# Patient Record
Sex: Male | Born: 1985 | Race: White | Hispanic: No | Marital: Single | Smoking: Current every day smoker
Health system: Southern US, Community
[De-identification: ages and names within clinical notes are randomized; demographics above are authoritative.]

## PROBLEM LIST (undated history)

## (undated) DIAGNOSIS — B977 Papillomavirus as the cause of diseases classified elsewhere: Secondary | ICD-10-CM

## (undated) DIAGNOSIS — H8109 Meniere's disease, unspecified ear: Secondary | ICD-10-CM

## (undated) DIAGNOSIS — F1910 Other psychoactive substance abuse, uncomplicated: Secondary | ICD-10-CM

## (undated) HISTORY — DX: Other psychoactive substance abuse, uncomplicated: F19.10

## (undated) HISTORY — DX: Meniere's disease, unspecified ear: H81.09

---
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
## 2004-03-31 ENCOUNTER — Inpatient Hospital Stay (HOSPITAL_COMMUNITY): Admission: AC | Admit: 2004-03-31 | Discharge: 2004-04-02 | Payer: Self-pay

## 2005-07-08 ENCOUNTER — Ambulatory Visit: Payer: Self-pay | Admitting: Family Medicine

## 2005-09-09 ENCOUNTER — Ambulatory Visit: Payer: Self-pay | Admitting: Family Medicine

## 2006-05-07 HISTORY — PX: WISDOM TOOTH EXTRACTION: SHX21

## 2006-08-11 ENCOUNTER — Ambulatory Visit: Payer: Self-pay | Admitting: Internal Medicine

## 2006-08-24 ENCOUNTER — Ambulatory Visit: Payer: Self-pay | Admitting: Internal Medicine

## 2006-10-07 HISTORY — PX: LASIK: SHX215

## 2008-01-02 ENCOUNTER — Ambulatory Visit: Payer: Self-pay | Admitting: Family Medicine

## 2008-01-02 DIAGNOSIS — M795 Residual foreign body in soft tissue: Secondary | ICD-10-CM

## 2008-01-30 ENCOUNTER — Ambulatory Visit: Payer: Self-pay | Admitting: Family Medicine

## 2008-01-31 ENCOUNTER — Ambulatory Visit: Payer: Self-pay | Admitting: Family Medicine

## 2008-01-31 LAB — CONVERTED CEMR LAB
ALT: 17 units/L (ref 0–53)
AST: 19 units/L (ref 0–37)
Alkaline Phosphatase: 54 units/L (ref 39–117)
BUN: 10 mg/dL (ref 6–23)
Basophils Relative: 1 % (ref 0.0–1.0)
Bilirubin, Direct: 0.1 mg/dL (ref 0.0–0.3)
Calcium: 9.6 mg/dL (ref 8.4–10.5)
Chloride: 105 meq/L (ref 96–112)
Creatinine, Ser: 1 mg/dL (ref 0.4–1.5)
Eosinophils Relative: 3.5 % (ref 0.0–5.0)
GFR calc Af Amer: 119 mL/min
GFR calc non Af Amer: 98 mL/min
Glucose, Bld: 82 mg/dL (ref 70–99)
HCT: 44 % (ref 39.0–52.0)
HDL: 89.8 mg/dL (ref >39.0)
Lymphocytes Relative: 27.9 % (ref 12.0–46.0)
Monocytes Absolute: 0.7 10*3/uL (ref 0.2–0.7)
Monocytes Relative: 6.9 % (ref 3.0–11.0)
Neutro Abs: 6.1 10*3/uL (ref 1.4–7.7)
Neutrophils Relative %: 60.7 % (ref 43.0–77.0)
Platelets: 245 10*3/uL (ref 150–400)
Potassium: 4.4 meq/L (ref 3.5–5.1)
RDW: 12.6 % (ref 11.5–14.6)
TSH: 1.84 microintl units/mL (ref 0.35–5.50)
Total CHOL/HDL Ratio: 2.4
Total Protein: 6.6 g/dL (ref 6.0–8.3)
Triglycerides: 43 mg/dL (ref 0–149)
VLDL: 9 mg/dL (ref 0–40)
WBC: 10 10*3/uL (ref 4.5–10.5)

## 2009-08-27 ENCOUNTER — Ambulatory Visit: Payer: Self-pay | Admitting: Family Medicine

## 2009-08-27 DIAGNOSIS — G4730 Sleep apnea, unspecified: Secondary | ICD-10-CM | POA: Insufficient documentation

## 2009-08-31 ENCOUNTER — Encounter: Payer: Self-pay | Admitting: Family Medicine

## 2010-05-26 ENCOUNTER — Ambulatory Visit: Payer: Self-pay | Admitting: Family Medicine

## 2010-05-26 DIAGNOSIS — H9190 Unspecified hearing loss, unspecified ear: Secondary | ICD-10-CM | POA: Insufficient documentation

## 2010-05-27 ENCOUNTER — Encounter: Payer: Self-pay | Admitting: Family Medicine

## 2010-06-14 ENCOUNTER — Ambulatory Visit: Payer: Self-pay | Admitting: Family Medicine

## 2010-06-14 DIAGNOSIS — K5289 Other specified noninfective gastroenteritis and colitis: Secondary | ICD-10-CM | POA: Insufficient documentation

## 2010-06-15 ENCOUNTER — Encounter (INDEPENDENT_AMBULATORY_CARE_PROVIDER_SITE_OTHER): Payer: Self-pay | Admitting: *Deleted

## 2010-07-14 ENCOUNTER — Telehealth: Payer: Self-pay | Admitting: Family Medicine

## 2010-07-14 DIAGNOSIS — F429 Obsessive-compulsive disorder, unspecified: Secondary | ICD-10-CM

## 2010-10-04 ENCOUNTER — Encounter: Admission: RE | Admit: 2010-10-04 | Discharge: 2010-10-04 | Payer: Self-pay | Admitting: Otolaryngology

## 2010-11-27 ENCOUNTER — Encounter: Payer: Self-pay | Admitting: Otolaryngology

## 2010-12-09 NOTE — Consult Note (Signed)
Summary: [facility]  [facility]   Imported By: Lanelle Bal 06/02/2010 12:12:00  _____________________________________________________________________  External Attachment:    Type:   Image     Comment:   External Document

## 2010-12-09 NOTE — Progress Notes (Signed)
Summary: Wants referral for OCD  Phone Note Call from Patient Call back at [phone]   Caller: Patient Call For: Dr Milinda Antis Summary of Call: Pt would like to see psychologist or psychiatrist re to OCD. Pt wondered if he would have to make an appt with you to get this referral or can you do a referral with out pt being seen. Please advise.  Initial call taken by: Lewanda Rife LPN,  July 14, 2010 4:24 PM  Follow-up for Phone Call        that is fine -just let me know a few symptoms he is having so I can refer Follow-up by: Judith Part MD,  July 14, 2010 4:53 PM  Additional Follow-up for Phone Call Additional follow up Details #1::        Pt said it takes him awhile to leave his house in the mornings because he has to double ck the water being off, the stove is off, the doors are locked. and when the pt gets out of his car he has to double ck the cars in park, the windows are all up and the doors are locked. Pt would like Shirlee Limerick to call him before she makes an appt to discuss his availabilty and where he will be going. 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

## 2010-12-09 NOTE — Letter (Signed)
Summary: Out of Work  [employer] at [facility]  [address] [place], [place] [address]   Phone: [phone]  Fax: [phone]    June 14, 2010   Employee:  DALLIN MCCORKEL    To Whom It May Concern:   For Medical reasons, please excuse the above named employee from work for the following dates:  Start:   06/14/2010  End:   can return 06/16/2010 if he is feeling better   If you need additional information, please feel free to contact our office.         Sincerely,    Judith Part MD

## 2010-12-09 NOTE — Letter (Signed)
Summary: [name] letter  [place] at [facility]  [address] [place], [place] [address]   Phone: [phone]  Fax: [phone]       06/15/2010 MRN: [id number]  Lance Foley [address] [place], [place]  [address]  Dear Mr. [name] [place], and [place] announce the retirement of Arta Silence, M.D., from full-time practice at the [facility] office effective May 06, 2010 and his plans of returning part-time.  It is important to [name] and to our practice that you understand that [name] has seven physicians in our office for your health care needs.  We will continue to offer the same exceptional care that you have today.    [name] has spoken to many of you about his plans for retirement and returning part-time in the fall.   We will continue to work with you through the transition to schedule appointments for you in the office and meet the high standards that [facility] is committed to.   Again, it is with great pleasure that we share the news that [name] will return to [facility] at [facility] in October of 2011 with a reduced schedule.    If you have any questions, or would like to request an appointment with one of our physicians, please call us at [phone] and press the option for Scheduling an appointment.  We take pleasure in providing you with excellent patient care and look forward to seeing you at your next office visit.  Our [facility] Physicians are:  [name], M.D. [name], M.D. [name], M.D. [name], M.D. [name], M.D. [name], M.D. We proudly welcomed [name], M.D. and [name], M.D. to the practice in July/August 2011.  Sincerely,   [facility]
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
## 2011-03-30 ENCOUNTER — Encounter: Payer: Self-pay | Admitting: Family Medicine

## 2011-04-01 ENCOUNTER — Ambulatory Visit (INDEPENDENT_AMBULATORY_CARE_PROVIDER_SITE_OTHER): Payer: Managed Care, Other (non HMO) | Admitting: Family Medicine

## 2011-04-01 ENCOUNTER — Encounter: Payer: Self-pay | Admitting: Family Medicine

## 2011-04-01 DIAGNOSIS — Z136 Encounter for screening for cardiovascular disorders: Secondary | ICD-10-CM

## 2011-04-01 DIAGNOSIS — F430 Acute stress reaction: Secondary | ICD-10-CM | POA: Insufficient documentation

## 2011-04-01 DIAGNOSIS — R0602 Shortness of breath: Secondary | ICD-10-CM | POA: Insufficient documentation

## 2011-04-01 NOTE — Assessment & Plan Note (Signed)
I feel this may be causing some of his physical symptoms today  Disc opt for tx incl watchful waiting with good support / journal/ fitness/ caff cess/ smok cess Counseling with mental health prof And medciation  Pt chooses to manage on his own for now  Disc coping tech and sympt in detail >25 min spent with face to face with patient counseling and coordinating care Will update me if symptoms worsen

## 2011-04-01 NOTE — Patient Instructions (Signed)
I'm reassured by your EKG and exam today  I feel that some of your symptoms are likely part of a stress reaction  If you are interested in counseling or medication in the future - let us know  If you get worse -- alert me  Please work on gradual caffiene withdrawal , and smoking cessation and fitting some sort of exercise

## 2011-04-01 NOTE — Progress Notes (Signed)
Subjective:    Patient ID: Lance Foley, male    DOB: 1986-06-20, 25 y.o.   MRN: [id number]  HPI Tuesday was driving home and had the sudden sensation that he could not get a good complete breath Made him uneasy  Wed felt a bit worse- tired and generally bad-- ? 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  Eyes: Negative for pain and visual disturbance.  Respiratory: Negative for cough and pos for sob   Cardiovascular: Negative.for cp or edema    Gastrointestinal: Negative for nausea, diarrhea and constipation.  Genitourinary: Negative for urgency and frequency.  Skin: Negative for pallor.or rash   Neurological: Negative for weakness, light-headedness, numbness and headaches.  Hematological: Negative for adenopathy. Does not bruise/bleed easily.  Psychiatric/Behavioral: pos for stress  reaction and anxiety, no SI or lack of motivation        Objective:   Physical Exam  Constitutional: He appears well-developed and well-nourished. No distress.       Slim and well appearing   HENT:  Head: Normocephalic and atraumatic.  Mouth/Throat: Oropharynx is clear and moist.  Eyes: Conjunctivae and EOM are normal. Pupils are equal, round, and reactive to light.  Neck: Normal range of motion. Neck supple. No JVD present. No thyromegaly present.  Cardiovascular: Normal rate, regular rhythm, normal heart sounds and intact distal pulses.   Pulmonary/Chest: Effort normal and breath sounds normal. No respiratory distress. He has no wheezes. He has no rales. He exhibits no tenderness.  Abdominal: Soft. Bowel sounds are normal. He exhibits no distension and no mass. There is no tenderness.  Musculoskeletal: Normal range of motion. He exhibits no edema and no tenderness.  Lymphadenopathy:    He has no cervical adenopathy.  Neurological: He is alert. He displays normal reflexes. No cranial nerve deficit. Coordination normal.       No tremor   Skin: Skin is warm and dry. No rash noted. No erythema. No pallor.  Psychiatric: His behavior is normal.       Seems stressed and mildly anxious Intense affect Nl eye contact  Comm skills are good           Assessment & Plan:

## 2011-04-01 NOTE — Assessment & Plan Note (Addendum)
Suspect this and twinges of chest pain (atypical) are likely due to stress reaction Disc this in detail Pt does have remote hx of depression and ocd Re assuring EKG and no exertional symptoms (EKG with nl sinus rhythm rate of 73 and no acute changes) Symptom free today Will update if this becomes recurrent

## 2012-01-10 ENCOUNTER — Encounter: Payer: Self-pay | Admitting: Family Medicine

## 2012-01-10 ENCOUNTER — Ambulatory Visit (INDEPENDENT_AMBULATORY_CARE_PROVIDER_SITE_OTHER): Payer: BC Managed Care – PPO | Admitting: Family Medicine

## 2012-01-10 VITALS — BP 120/86 | HR 69 | Temp 98.3°F | Wt 161.0 lb

## 2012-01-10 DIAGNOSIS — F411 Generalized anxiety disorder: Secondary | ICD-10-CM

## 2012-01-10 DIAGNOSIS — F418 Other specified anxiety disorders: Secondary | ICD-10-CM | POA: Insufficient documentation

## 2012-01-10 DIAGNOSIS — F419 Anxiety disorder, unspecified: Secondary | ICD-10-CM

## 2012-01-10 MED ORDER — SERTRALINE HCL 25 MG PO TABS: 25.0000 mg | ORAL_TABLET | Freq: Every day | ORAL | Status: DC
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
## 2012-01-10 NOTE — Assessment & Plan Note (Signed)
With some OCD features including intrusive thoughts/ negativity/ and repeated checking/ worry  Coping skills and insight are fair  Some anhedonia Pt not interested in counseling yet Has family history  Worries about med re: family rxn to it also  Made plan to start zoloft (generic is important with his insurance) - 25 mg (slow start) Rev poss side eff in detail incl worse symptoms or SI- voices understanding to stop med and call if problems >25 min spent with face to face with patient, >50% counseling and/or coordinating care   F/u 4-6 weeks

## 2012-01-10 NOTE — Patient Instructions (Signed)
Start zoloft 25 mg daily - take it in evening  If any side effects - like feeling more anxious or depressed- stop it and let me know  Try to get some exercise  Try to limit caffeine to 1 cup of coffee per day  Drink water and eat a healthy diet

## 2012-02-14 ENCOUNTER — Ambulatory Visit: Payer: BC Managed Care – PPO | Admitting: Family Medicine

## 2012-02-14 DIAGNOSIS — Z0289 Encounter for other administrative examinations: Secondary | ICD-10-CM

## 2012-08-01 ENCOUNTER — Encounter: Payer: Self-pay | Admitting: Family Medicine

## 2012-08-01 ENCOUNTER — Ambulatory Visit (INDEPENDENT_AMBULATORY_CARE_PROVIDER_SITE_OTHER): Payer: BC Managed Care – PPO | Admitting: Family Medicine

## 2012-08-01 VITALS — BP 142/88 | HR 94 | Temp 98.5°F | Ht 70.0 in | Wt 168.2 lb

## 2012-08-01 DIAGNOSIS — J209 Acute bronchitis, unspecified: Secondary | ICD-10-CM

## 2012-08-01 DIAGNOSIS — F17200 Nicotine dependence, unspecified, uncomplicated: Secondary | ICD-10-CM | POA: Insufficient documentation

## 2012-08-01 MED ORDER — AZITHROMYCIN 250 MG PO TABS: ORAL_TABLET | ORAL | Status: DC

## 2012-08-01 MED ORDER — ALBUTEROL SULFATE HFA 108 (90 BASE) MCG/ACT IN AERS: 2.0000 | INHALATION_SPRAY | RESPIRATORY_TRACT | Status: DC | PRN

## 2012-08-01 NOTE — Assessment & Plan Note (Signed)
In a smoker with rhonchi and wheeze on exam tx with zpack/ albuterol mdi (inst in use) and sympt care Disc symptomatic care - see instructions on AVS  Update if not starting to improve in a week or if worsening  Counseled to quit smoking

## 2012-08-01 NOTE — Assessment & Plan Note (Signed)
Disc in detail risks of smoking and possible outcomes including copd, vascular/ heart disease, cancer , respiratory and sinus infections  Pt voices understanding Pt is using an e cig part time now

## 2012-08-01 NOTE — Patient Instructions (Addendum)
Drink lots of fluids Rest  Continue mucinex (the DM is good for cough if you feel like you want to switch)  Take the zpak as directed Albuterol inhaler as needed for wheezing or tightness  Update if not starting to improve in a week or if worsening   Think hard about quitting smoking
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
## 2012-10-24 ENCOUNTER — Encounter: Payer: Self-pay | Admitting: Family Medicine

## 2012-10-24 ENCOUNTER — Ambulatory Visit (INDEPENDENT_AMBULATORY_CARE_PROVIDER_SITE_OTHER): Payer: BC Managed Care – PPO | Admitting: Family Medicine

## 2012-10-24 VITALS — BP 130/85 | HR 82 | Temp 98.4°F | Ht 70.0 in | Wt 179.8 lb

## 2012-10-24 DIAGNOSIS — D1801 Hemangioma of skin and subcutaneous tissue: Secondary | ICD-10-CM

## 2012-10-24 NOTE — Patient Instructions (Addendum)
The skin spot on your arm appears to be an angioma-not worried about it - keep an eye on it  Wear your sun protection  Blood pressure has been trending up a bit - we want to watch that  Start regular exercise- aim for 5 days per week 30 minutes as your goal -and do something you enjoy  Schedule a physical with labs prior at check out

## 2012-10-24 NOTE — Progress Notes (Signed)
  Subjective:    Patient ID: Lance Foley, male    DOB: 1986/02/03, 26 y.o.   MRN: [id number]  HPI Has a spot/mole on L arm to check  There for at least 1 month No pain or itching   Has been sunburned on arms in the past  Is careful about sunblock now   bp was a bit high today Re check 130/85 Knows he needs to start exercising    Review of Systems Review of Systems  Constitutional: Negative for fever, appetite change, fatigue and unexpected weight change.  Eyes: Negative for pain and visual disturbance.  Respiratory: Negative for cough and shortness of breath.   Cardiovascular: Negative for cp or palpitations    Gastrointestinal: Negative for nausea, diarrhea and constipation.  Genitourinary: Negative for urgency and frequency.  Skin: Negative for pallor or rash   Neurological: Negative for weakness, light-headedness, numbness and headaches.  Hematological: Negative for adenopathy. Does not bruise/bleed easily.  Psychiatric/Behavioral: Negative for dysphoric mood. The patient is not nervous/anxious.         Objective:   Physical Exam  Constitutional: He appears well-developed and well-nourished. No distress.  HENT:  Head: Normocephalic and atraumatic.  Eyes: Conjunctivae normal and EOM are normal. Pupils are equal, round, and reactive to light.  Neck: Normal range of motion. Neck supple.  Cardiovascular: Normal rate and regular rhythm.   Pulmonary/Chest: Effort normal and breath sounds normal.  Musculoskeletal: He exhibits no edema.  Lymphadenopathy:    He has no cervical adenopathy.  Neurological: He is alert. He has normal reflexes.  Skin: Skin is warm and dry. No rash noted. No erythema. No pallor.       Tiny red lesion consistent with angioma on L forearm Several on back as well Some solar lentigos No rash  Psychiatric: He has a normal mood and affect.          Assessment & Plan:

## 2012-10-24 NOTE — Assessment & Plan Note (Signed)
Tiny 1-2 mm red macular lesion on L forearm consistent with small angioma Saw several on back also Reassured  Will watch this  Pt will schedule PE upcoming as well Disc skin ca prev

## 2012-12-11 ENCOUNTER — Encounter: Payer: Self-pay | Admitting: Family Medicine

## 2012-12-11 ENCOUNTER — Ambulatory Visit (INDEPENDENT_AMBULATORY_CARE_PROVIDER_SITE_OTHER): Payer: BC Managed Care – PPO | Admitting: Family Medicine

## 2012-12-11 VITALS — BP 132/90 | HR 90 | Temp 98.2°F | Ht 70.0 in | Wt 180.5 lb

## 2012-12-11 DIAGNOSIS — G933 Postviral fatigue syndrome: Secondary | ICD-10-CM | POA: Insufficient documentation

## 2012-12-11 DIAGNOSIS — R5381 Other malaise: Secondary | ICD-10-CM

## 2012-12-11 DIAGNOSIS — R5383 Other fatigue: Secondary | ICD-10-CM

## 2012-12-11 LAB — CBC WITH DIFFERENTIAL/PLATELET
Basophils Relative: 0.5 % (ref 0.0–3.0)
Eosinophils Absolute: 0.3 10*3/uL (ref 0.0–0.7)
Eosinophils Relative: 3.3 % (ref 0.0–5.0)
Hemoglobin: 17.2 g/dL — ABNORMAL HIGH (ref 13.0–17.0)
MCHC: 33.9 g/dL (ref 30.0–36.0)
Monocytes Absolute: 0.7 10*3/uL (ref 0.1–1.0)
Monocytes Relative: 7.5 % (ref 3.0–12.0)
Neutro Abs: 6.1 10*3/uL (ref 1.4–7.7)
Neutrophils Relative %: 66 % (ref 43.0–77.0)
Platelets: 246 10*3/uL (ref 150.0–400.0)
RBC: 5.43 Mil/uL (ref 4.22–5.81)
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
## 2012-12-11 NOTE — Assessment & Plan Note (Signed)
See assessment for fatigue Pt had flu like illness followed by proufound fatigue (but no other symptoms) Nl exam  Lab today  Adv out of work until mon to rest and update if no further imp

## 2012-12-11 NOTE — Assessment & Plan Note (Signed)
S/p viral illness- suspect a post viral syndrome-he may have had the flu  With lack of other symptoms besides fatigue and also nl exam - will also check cbc and tsh today Adv pt to please call if he develops cough or fever or other symptoms

## 2012-12-11 NOTE — Patient Instructions (Addendum)
I think you have a post viral syndrome but we are still doing some labs for fatigue today Work note written- get as much rest as you can  Drink lots of fluids If new symptoms begin like cough/ fever / facial pain - please call and let me know - or if you do not improve

## 2012-12-12 ENCOUNTER — Encounter: Payer: Self-pay | Admitting: *Deleted

## 2012-12-13 ENCOUNTER — Telehealth: Payer: Self-pay

## 2012-12-13 NOTE — Telephone Encounter (Signed)
Pt request copy of record for 12/11/12 visit to give to employer and request lab results.left v/m for pt to call back.

## 2012-12-14 NOTE — Telephone Encounter (Signed)
Left voicemail letting pt know record from [facility] ready for pick-up and that we mailed a copy of his labs to his home already

## 2013-02-27 ENCOUNTER — Other Ambulatory Visit: Payer: BC Managed Care – PPO

## 2013-03-06 ENCOUNTER — Encounter: Payer: BC Managed Care – PPO | Admitting: Family Medicine

## 2013-06-10 ENCOUNTER — Telehealth: Payer: Self-pay | Admitting: Family Medicine

## 2013-06-10 DIAGNOSIS — Z0000 Encounter for general adult medical examination without abnormal findings: Secondary | ICD-10-CM

## 2013-06-10 DIAGNOSIS — Z0001 Encounter for general adult medical examination with abnormal findings: Secondary | ICD-10-CM | POA: Insufficient documentation

## 2013-06-10 NOTE — Telephone Encounter (Signed)
Message copied by Judy Pimple on Mon Jun 10, 2013 10:03 PM ------      Message from: Alvina Chou      Created: Tue May 28, 2013 11:36 AM      Regarding: Lab orders for Tuesday, 8.5.14       Patient is scheduled for CPX labs, please order future labs, Thanks , Terri       ------

## 2013-06-11 ENCOUNTER — Other Ambulatory Visit: Payer: BC Managed Care – PPO

## 2013-06-18 ENCOUNTER — Encounter: Payer: BC Managed Care – PPO | Admitting: Family Medicine

## 2013-09-05 ENCOUNTER — Encounter: Payer: Self-pay | Admitting: Family Medicine

## 2013-09-05 ENCOUNTER — Ambulatory Visit (INDEPENDENT_AMBULATORY_CARE_PROVIDER_SITE_OTHER): Payer: Self-pay | Admitting: Family Medicine

## 2013-09-05 VITALS — BP 120/84 | HR 71 | Temp 98.0°F | Ht 70.0 in | Wt 175.5 lb

## 2013-09-05 DIAGNOSIS — S60559A Superficial foreign body of unspecified hand, initial encounter: Secondary | ICD-10-CM

## 2013-09-05 DIAGNOSIS — S60552A Superficial foreign body of left hand, initial encounter: Secondary | ICD-10-CM
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
## 2013-11-12 ENCOUNTER — Telehealth: Payer: Self-pay | Admitting: Family Medicine

## 2013-11-12 NOTE — Telephone Encounter (Signed)
Please put him in a 30 min slot-thanks

## 2013-11-12 NOTE — Telephone Encounter (Signed)
Left mssg for pt to c/b °

## 2013-11-12 NOTE — Telephone Encounter (Signed)
Pt scheduled 11/20/2013

## 2013-11-12 NOTE — Telephone Encounter (Signed)
Pt needs a CPE prior to the end of February, 2015 due to insurance purposes, however, your next available CPE apptmt is not until 03/11/2014. Can you accommodate pt a CPE prior to the end of February? Thank you.

## 2013-11-13 ENCOUNTER — Other Ambulatory Visit: Payer: Self-pay

## 2013-11-13 ENCOUNTER — Telehealth: Payer: Self-pay

## 2013-11-13 DIAGNOSIS — R5383 Other fatigue: Secondary | ICD-10-CM

## 2013-11-13 NOTE — Telephone Encounter (Signed)
I added it for dx of fatigue

## 2013-11-13 NOTE — Telephone Encounter (Signed)
Left voicemail letting pt know lab test added

## 2013-11-13 NOTE — Telephone Encounter (Signed)
I added it

## 2013-11-13 NOTE — Telephone Encounter (Signed)
Pt left v/m; pt scheduled for CPX lab on 0/08/15 and request to add testosterone level.Please advise.

## 2013-11-14 ENCOUNTER — Other Ambulatory Visit (INDEPENDENT_AMBULATORY_CARE_PROVIDER_SITE_OTHER): Payer: BC Managed Care – PPO

## 2013-11-14 DIAGNOSIS — R5381 Other malaise: Secondary | ICD-10-CM

## 2013-11-14 DIAGNOSIS — R5383 Other fatigue: Secondary | ICD-10-CM

## 2013-11-14 DIAGNOSIS — Z0000 Encounter for general adult medical examination without abnormal findings: Secondary | ICD-10-CM

## 2013-11-14 LAB — CBC WITH DIFFERENTIAL/PLATELET
BASOS ABS: 0 10*3/uL (ref 0.0–0.1)
Basophils Relative: 0.3 % (ref 0.0–3.0)
EOS ABS: 0.2 10*3/uL (ref 0.0–0.7)
Eosinophils Relative: 1.4 % (ref 0.0–5.0)
HCT: 43.1 % (ref 39.0–52.0)
HEMOGLOBIN: 14.9 g/dL (ref 13.0–17.0)
LYMPHS PCT: 16.7 % (ref 12.0–46.0)
Lymphs Abs: 2.3 10*3/uL (ref 0.7–4.0)
MCHC: 34.5 g/dL (ref 30.0–36.0)
MCV: 91.3 fl (ref 78.0–100.0)
Monocytes Absolute: 0.8 10*3/uL (ref 0.1–1.0)
Monocytes Relative: 5.6 % (ref 3.0–12.0)
NEUTROS ABS: 10.7 10*3/uL — AB (ref 1.4–7.7)
Neutrophils Relative %: 76 % (ref 43.0–77.0)
Platelets: 275 10*3/uL (ref 150.0–400.0)
RBC: 4.72 Mil/uL (ref 4.22–5.81)
RDW: 13.6 % (ref 11.5–14.6)
WBC: 14.1 10*3/uL — ABNORMAL HIGH (ref 4.5–10.5)

## 2013-11-14 LAB — LDL CHOLESTEROL, DIRECT: Direct LDL: 187.2 mg/dL

## 2013-11-14 LAB — LIPID PANEL
Cholesterol: 284 mg/dL — ABNORMAL HIGH (ref 0–200)
HDL: 79.5 mg/dL (ref >39.00)
TRIGLYCERIDES: 41 mg/dL (ref 0.0–149.0)
Total CHOL/HDL Ratio: 4
VLDL: 8.2 mg/dL (ref 0.0–40.0)

## 2013-11-14 LAB — COMPREHENSIVE METABOLIC PANEL
ALBUMIN: 4.4 g/dL (ref 3.5–5.2)
ALT: 30 U/L (ref 0–53)
AST: 24 U/L (ref 0–37)
Alkaline Phosphatase: 60 U/L (ref 39–117)
BUN: 17 mg/dL (ref 6–23)
CHLORIDE: 102 meq/L (ref 96–112)
CO2: 29 meq/L (ref 19–32)
CREATININE: 1 mg/dL (ref 0.4–1.5)
Calcium: 9.2 mg/dL (ref 8.4–10.5)
GFR: 96.86 mL/min (ref >60.00)
GLUCOSE: 91 mg/dL (ref 70–99)
POTASSIUM: 4 meq/L (ref 3.5–5.1)
SODIUM: 140 meq/L (ref 135–145)
Total Bilirubin: 0.5 mg/dL (ref 0.3–1.2)
Total Protein: 7.2 g/dL (ref 6.0–8.3)

## 2013-11-14 LAB — TSH: TSH: 1.68 u[IU]/mL (ref 0.35–5.50)

## 2013-11-14 LAB — TESTOSTERONE: Testosterone: 271.67 ng/dL — ABNORMAL LOW (ref 350.00–890.00)

## 2013-11-20 ENCOUNTER — Encounter: Payer: Self-pay | Admitting: Family Medicine

## 2013-11-20 ENCOUNTER — Ambulatory Visit (INDEPENDENT_AMBULATORY_CARE_PROVIDER_SITE_OTHER): Payer: BC Managed Care – PPO | Admitting: Family Medicine

## 2013-11-20 VITALS — BP 112/78 | HR 71 | Temp 97.8°F | Ht 68.5 in | Wt 182.2 lb

## 2013-11-20 DIAGNOSIS — R5381 Other malaise: Secondary | ICD-10-CM

## 2013-11-20 DIAGNOSIS — Z0000 Encounter for general adult medical examination without abnormal findings: Secondary | ICD-10-CM

## 2013-11-20 DIAGNOSIS — Z20818 Contact with and (suspected) exposure to other bacterial communicable diseases: Secondary | ICD-10-CM | POA: Insufficient documentation

## 2013-11-20 DIAGNOSIS — D72829 Elevated white blood cell count, unspecified: Secondary | ICD-10-CM

## 2013-11-20 DIAGNOSIS — F17200 Nicotine dependence, unspecified, uncomplicated: Secondary | ICD-10-CM

## 2013-11-20 DIAGNOSIS — E291 Testicular hypofunction: Secondary | ICD-10-CM

## 2013-11-20 DIAGNOSIS — R5383 Other fatigue: Secondary | ICD-10-CM

## 2013-11-20 DIAGNOSIS — Z2089 Contact with and (suspected) exposure to other communicable diseases: Secondary | ICD-10-CM

## 2013-11-20 DIAGNOSIS — E349 Endocrine disorder, unspecified: Secondary | ICD-10-CM | POA: Insufficient documentation

## 2013-11-20 DIAGNOSIS — E785 Hyperlipidemia, unspecified: Secondary | ICD-10-CM | POA: Insufficient documentation

## 2013-11-20 DIAGNOSIS — Z23 Encounter for immunization: Secondary | ICD-10-CM

## 2013-11-20 DIAGNOSIS — H8109 Meniere's disease, unspecified ear: Secondary | ICD-10-CM

## 2013-11-20 NOTE — Progress Notes (Signed)
Pre-visit discussion using our clinic review tool. No additional management support is needed unless otherwise documented below in the visit note.  
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
## 2013-11-20 NOTE — Patient Instructions (Signed)
Tdap vaccine today (tetanus)  Stop up front for referral to urology  Watch diet for cholesterol (Avoid red meat/ fried foods/ egg yolks/ fatty breakfast meats/ butter, cheese and high fat dairy/ and shellfish  )  I sent a throat culture out and will update you when I get that back - and we will watch your white blood cell count that is elevated     Fat and Cholesterol Control Diet Fat and cholesterol levels in your blood and organs are influenced by your diet. High levels of fat and cholesterol may lead to diseases of the heart, small and large blood vessels, gallbladder, liver, and pancreas. CONTROLLING FAT AND CHOLESTEROL WITH DIET Although exercise and lifestyle factors are important, your diet is key. That is because certain foods are known to raise cholesterol and others to lower it. The goal is to balance foods for their effect on cholesterol and more importantly, to replace saturated and trans fat with other types of fat, such as monounsaturated fat, polyunsaturated fat, and omega-3 fatty acids. On average, a person should consume no more than 15 to 17 g of saturated fat daily. Saturated and trans fats are considered "bad" fats, and they will raise LDL cholesterol. Saturated fats are primarily found in animal products such as meats, butter, and cream. However, that does not mean you need to give up all your favorite foods. Today, there are good tasting, low-fat, low-cholesterol substitutes for most of the things you like to eat. Choose low-fat or nonfat alternatives. Choose round or loin cuts of red meat. These types of cuts are lowest in fat and cholesterol. Chicken (without the skin), fish, veal, and ground [place] breast are great choices. Eliminate fatty meats, such as hot dogs and salami. Even shellfish have little or no saturated fat. Have a 3 oz (85 g) portion when you eat lean meat, poultry, or fish. Trans fats are also called "partially hydrogenated oils." They are oils that have been  scientifically manipulated so that they are solid at room temperature resulting in a longer shelf life and improved taste and texture of foods in which they are added. Trans fats are found in stick margarine, some tub margarines, cookies, crackers, and baked goods.  When baking and cooking, oils are a great substitute for butter. The monounsaturated oils are especially beneficial since it is believed they lower LDL and raise HDL. The oils you should avoid entirely are saturated tropical oils, such as coconut and palm.  Remember to eat a lot from food groups that are naturally free of saturated and trans fat, including fish, fruit, vegetables, beans, grains (barley, rice, couscous, bulgur wheat), and pasta (without cream sauces).  IDENTIFYING FOODS THAT LOWER FAT AND CHOLESTEROL  Soluble fiber may lower your cholesterol. This type of fiber is found in fruits such as apples, vegetables such as broccoli, potatoes, and carrots, legumes such as beans, peas, and lentils, and grains such as barley. Foods fortified with plant sterols (phytosterol) may also lower cholesterol. You should eat at least 2 g per day of these foods for a cholesterol lowering effect.  Read package labels to identify low-saturated fats, trans fat free, and low-fat foods at the supermarket. Select cheeses that have only 2 to 3 g saturated fat per ounce. Use a heart-healthy tub margarine that is free of trans fats or partially hydrogenated oil. When buying baked goods (cookies, crackers), avoid partially hydrogenated oils. Breads and muffins should be made from whole grains (whole-wheat or whole oat flour, instead of "  flour" or "enriched flour"). Buy non-creamy canned soups with reduced salt and no added fats.  FOOD PREPARATION TECHNIQUES  Never deep-fry. If you must fry, either stir-fry, which uses very little fat, or use non-stick cooking sprays. When possible, broil, bake, or roast meats, and steam vegetables. Instead of putting butter or  margarine on vegetables, use lemon and herbs, applesauce, and cinnamon (for squash and sweet potatoes). Use nonfat yogurt, salsa, and low-fat dressings for salads.  LOW-SATURATED FAT / LOW-FAT FOOD SUBSTITUTES Meats / Saturated Fat (g)  Avoid: Steak, marbled (3 oz/85 g) / 11 g  Choose: Steak, lean (3 oz/85 g) / 4 g  Avoid: Hamburger (3 oz/85 g) / 7 g  Choose: Hamburger, lean (3 oz/85 g) / 5 g  Avoid: Ham (3 oz/85 g) / 6 g  Choose: Ham, lean cut (3 oz/85 g) / 2.4 g  Avoid: Chicken, with skin, dark meat (3 oz/85 g) / 4 g  Choose: Chicken, skin removed, dark meat (3 oz/85 g) / 2 g  Avoid: Chicken, with skin, light meat (3 oz/85 g) / 2.5 g  Choose: Chicken, skin removed, light meat (3 oz/85 g) / 1 g Dairy / Saturated Fat (g)  Avoid: Whole milk (1 cup) / 5 g  Choose: Low-fat milk, 2% (1 cup) / 3 g  Choose: Low-fat milk, 1% (1 cup) / 1.5 g  Choose: Skim milk (1 cup) / 0.3 g  Avoid: Hard cheese (1 oz/28 g) / 6 g  Choose: Skim milk cheese (1 oz/28 g) / 2 to 3 g  Avoid: Cottage cheese, 4% fat (1 cup) / 6.5 g  Choose: Low-fat cottage cheese, 1% fat (1 cup) / 1.5 g  Avoid: Ice cream (1 cup) / 9 g  Choose: Sherbet (1 cup) / 2.5 g  Choose: Nonfat frozen yogurt (1 cup) / 0.3 g  Choose: Frozen fruit bar / trace  Avoid: Whipped cream (1 tbs) / 3.5 g  Choose: Nondairy whipped topping (1 tbs) / 1 g Condiments / Saturated Fat (g)  Avoid: Mayonnaise (1 tbs) / 2 g  Choose: Low-fat mayonnaise (1 tbs) / 1 g  Avoid: Butter (1 tbs) / 7 g  Choose: Extra light margarine (1 tbs) / 1 g  Avoid: Coconut oil (1 tbs) / 11.8 g  Choose: Olive oil (1 tbs) / 1.8 g  Choose: Corn oil (1 tbs) / 1.7 g  Choose: Safflower oil (1 tbs) / 1.2 g  Choose: Sunflower oil (1 tbs) / 1.4 g  Choose: Soybean oil (1 tbs) / 2.4 g  Choose: Canola oil (1 tbs) / 1 g Document Released: 10/24/2005 Document Revised: 02/18/2013 Document Reviewed: 04/14/2011 [employer] Patient Information 2014 [place],  [place].

## 2013-11-21 NOTE — Assessment & Plan Note (Signed)
New elevated lipids Disc goals for lipids and reasons to control them Rev labs with pt Rev low sat fat diet in detail  Will work on diet before re checking

## 2013-11-21 NOTE — Assessment & Plan Note (Signed)
?   If from low testosterone  Will ref to urol for that

## 2013-11-21 NOTE — Assessment & Plan Note (Signed)
Reviewed health habits including diet and exercise and skin cancer prevention Reviewed appropriate screening tests for age  Also reviewed health mt list, fam hx and immunization status , as well as social and family history   See HPI Lab reviewed  Disc need for exercise

## 2013-11-21 NOTE — Assessment & Plan Note (Signed)
Disc in detail risks of smoking and possible outcomes including copd, vascular/ heart disease, cancer , respiratory and sinus infections  Pt voices understanding  

## 2013-11-21 NOTE — Assessment & Plan Note (Signed)
Symptomatic  Sluggish/ fatigue/ and low libido Pt denies depression or other symptoms  Rev to urology for further eval and tx if necessary

## 2013-11-21 NOTE — Assessment & Plan Note (Signed)
Now wbc up  Had st -now better Throat cx obtained

## 2013-11-21 NOTE — Assessment & Plan Note (Signed)
?   etiol Pt had exp to strep recently and thought he had the symptoms (ST)- no more ST but will do throat cx  No fever or other symptoms

## 2013-11-22 LAB — CULTURE, GROUP A STREP: ORGANISM ID, BACTERIA: NORMAL

## 2013-12-03 ENCOUNTER — Other Ambulatory Visit (INDEPENDENT_AMBULATORY_CARE_PROVIDER_SITE_OTHER): Payer: BC Managed Care – PPO

## 2013-12-03 DIAGNOSIS — D72829 Elevated white blood cell count, unspecified: Secondary | ICD-10-CM

## 2013-12-03 LAB — CBC WITH DIFFERENTIAL/PLATELET
BASOS ABS: 0.1 10*3/uL (ref 0.0–0.1)
BASOS PCT: 0.5 % (ref 0.0–3.0)
EOS PCT: 2.8 % (ref 0.0–5.0)
Eosinophils Absolute: 0.4 10*3/uL (ref 0.0–0.7)
HCT: 46.7 % (ref 39.0–52.0)
Hemoglobin: 15.5 g/dL (ref 13.0–17.0)
LYMPHS ABS: 2.7 10*3/uL (ref 0.7–4.0)
Lymphocytes Relative: 21.5 % (ref 12.0–46.0)
MCHC: 33.1 g/dL (ref 30.0–36.0)
MCV: 93.7 fl (ref 78.0–100.0)
MONO ABS: 0.6 10*3/uL (ref 0.1–1.0)
Monocytes Relative: 4.4 % (ref 3.0–12.0)
NEUTROS PCT: 70.8 % (ref 43.0–77.0)
Neutro Abs: 9 10*3/uL — ABNORMAL HIGH (ref 1.4–7.7)
Platelets: 287 10*3/uL (ref 150.0–400.0)
RBC: 4.98 Mil/uL (ref 4.22–5.81)
RDW: 13.8 % (ref 11.5–14.6)
WBC: 12.8 10*3/uL — ABNORMAL HIGH (ref 4.5–10.5)

## 2013-12-05 LAB — ANTISTREPTOLYSIN O TITER: ASO: <25 IU/mL (ref <409)

## 2013-12-07 ENCOUNTER — Telehealth: Payer: Self-pay | Admitting: Family Medicine

## 2013-12-07 NOTE — Telephone Encounter (Signed)
Relevant patient education assigned to patient using Emmi. ° °

## 2014-01-14 ENCOUNTER — Other Ambulatory Visit (INDEPENDENT_AMBULATORY_CARE_PROVIDER_SITE_OTHER): Payer: BC Managed Care – PPO

## 2014-01-14 DIAGNOSIS — R7989 Other specified abnormal findings of blood chemistry: Secondary | ICD-10-CM

## 2014-01-14 LAB — CBC WITH DIFFERENTIAL/PLATELET
BASOS ABS: 0 10*3/uL (ref 0.0–0.1)
Basophils Relative: 0.3 % (ref 0.0–3.0)
EOS ABS: 0.2 10*3/uL (ref 0.0–0.7)
EOS PCT: 1.7 % (ref 0.0–5.0)
HEMATOCRIT: 47.7 % (ref 39.0–52.0)
Hemoglobin: 16.2 g/dL (ref 13.0–17.0)
LYMPHS PCT: 21.5 % (ref 12.0–46.0)
Lymphs Abs: 2.6 10*3/uL (ref 0.7–4.0)
MCHC: 33.9 g/dL (ref 30.0–36.0)
MCV: 93.3 fl (ref 78.0–100.0)
Monocytes Absolute: 0.5 10*3/uL (ref 0.1–1.0)
Monocytes Relative: 4.1 % (ref 3.0–12.0)
NEUTROS ABS: 8.7 10*3/uL — AB (ref 1.4–7.7)
Neutrophils Relative %: 72.4 % (ref 43.0–77.0)
PLATELETS: 255 10*3/uL (ref 150.0–400.0)
RBC: 5.11 Mil/uL (ref 4.22–5.81)
RDW: 13.8 % (ref 11.5–14.6)
WBC: 12 10*3/uL — AB (ref 4.5–10.5)

## 2014-01-21 ENCOUNTER — Encounter: Payer: Self-pay | Admitting: *Deleted

## 2014-01-22 ENCOUNTER — Ambulatory Visit: Payer: BC Managed Care – PPO | Admitting: Family Medicine

## 2014-07-09 ENCOUNTER — Other Ambulatory Visit: Payer: Self-pay | Admitting: Family Medicine

## 2014-07-09 DIAGNOSIS — D72829 Elevated white blood cell count, unspecified: Secondary | ICD-10-CM

## 2014-07-17 ENCOUNTER — Other Ambulatory Visit (INDEPENDENT_AMBULATORY_CARE_PROVIDER_SITE_OTHER): Payer: BC Managed Care – PPO

## 2014-07-17 DIAGNOSIS — D72829 Elevated white blood cell count, unspecified: Secondary | ICD-10-CM

## 2014-07-17 LAB — CBC WITH DIFFERENTIAL/PLATELET
BASOS ABS: 0.1 10*3/uL (ref 0.0–0.1)
BASOS PCT: 0.3 % (ref 0.0–3.0)
EOS ABS: 0.3 10*3/uL (ref 0.0–0.7)
Eosinophils Relative: 2 % (ref 0.0–5.0)
HCT: 44.6 % (ref 39.0–52.0)
Hemoglobin: 15 g/dL (ref 13.0–17.0)
Lymphocytes Relative: 20.4 % (ref 12.0–46.0)
Lymphs Abs: 3.1 10*3/uL (ref 0.7–4.0)
MCHC: 33.6 g/dL (ref 30.0–36.0)
MCV: 93.8 fl (ref 78.0–100.0)
MONO ABS: 0.8 10*3/uL (ref 0.1–1.0)
Monocytes Relative: 5 % (ref 3.0–12.0)
Neutro Abs: 11 10*3/uL — ABNORMAL HIGH (ref 1.4–7.7)
Neutrophils Relative %: 72.3 % (ref 43.0–77.0)
Platelets: 271 10*3/uL (ref 150.0–400.0)
RBC: 4.76 Mil/uL (ref 4.22–5.81)
RDW: 14.4 % (ref 11.5–15.5)
WBC: 15.3 10*3/uL — ABNORMAL HIGH (ref 4.0–10.5)

## 2014-07-21 ENCOUNTER — Telehealth: Payer: Self-pay | Admitting: Family Medicine

## 2014-07-21 DIAGNOSIS — D72829 Elevated white blood cell count, unspecified: Secondary | ICD-10-CM

## 2014-07-21 NOTE — Telephone Encounter (Signed)
Message copied by [name] on Mon Jul 21, 2014  5:37 PM ------      Message from: Tammi Sou      Created: Mon Jul 21, 2014  4:34 PM       Pt notified of lab results and does agree to see hematology, I advise pt Marion/Linda will call to schedule appt., please put in referral ------

## 2014-07-30 ENCOUNTER — Telehealth: Payer: Self-pay | Admitting: Hematology and Oncology

## 2014-07-30 NOTE — Telephone Encounter (Signed)
LEFT MESSAGE FOR PATIENT TO RETURN CALL  °

## 2014-08-14 ENCOUNTER — Ambulatory Visit (HOSPITAL_BASED_OUTPATIENT_CLINIC_OR_DEPARTMENT_OTHER): Payer: BC Managed Care – PPO | Admitting: Hematology and Oncology

## 2014-08-14 ENCOUNTER — Encounter: Payer: Self-pay | Admitting: Hematology and Oncology

## 2014-08-14 ENCOUNTER — Encounter (INDEPENDENT_AMBULATORY_CARE_PROVIDER_SITE_OTHER): Payer: Self-pay

## 2014-08-14 ENCOUNTER — Ambulatory Visit: Payer: BC Managed Care – PPO

## 2014-08-14 VITALS — BP 139/86 | HR 65 | Temp 98.4°F | Resp 18 | Ht 68.0 in | Wt 185.1 lb

## 2014-08-14 DIAGNOSIS — F17200 Nicotine dependence, unspecified, uncomplicated: Secondary | ICD-10-CM

## 2014-08-14 DIAGNOSIS — Z720 Tobacco use: Secondary | ICD-10-CM

## 2014-08-14 DIAGNOSIS — D72829 Elevated white blood cell count, unspecified: Secondary | ICD-10-CM

## 2014-08-14 NOTE — Assessment & Plan Note (Signed)
This is likely due to the mild chronic bronchitis from smoking. I reinforced the importance of smoking cessation. The patient does not need any further excessive workup. I will be happy to recheck his blood count in the future once he had quit smoking completely. I discussed with the patient the rationale of not pursuing excessive investigations and he agreed

## 2014-08-14 NOTE — Assessment & Plan Note (Signed)
I spent some time counseling the patient the importance of tobacco cessation. he is currently attempting to quit on his own  I gave him patient education handout and encouraged him to sign up for smoking cessation class.  
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
## 2014-08-14 NOTE — Progress Notes (Signed)
Checked in new pt with no financial concerns.  He has my card for any questions or concerns.

## 2014-12-24 ENCOUNTER — Telehealth: Payer: Self-pay | Admitting: Family Medicine

## 2014-12-24 DIAGNOSIS — Z0000 Encounter for general adult medical examination without abnormal findings: Secondary | ICD-10-CM

## 2014-12-24 NOTE — Telephone Encounter (Signed)
-----   Message from Ellamae Sia sent at 12/18/2014  4:19 PM EST ----- Regarding: Lab orders for Thursday,2.18.16 Patient is scheduled for CPX labs, please order future labs, Thanks , Karna Christmas

## 2014-12-25 ENCOUNTER — Other Ambulatory Visit (INDEPENDENT_AMBULATORY_CARE_PROVIDER_SITE_OTHER): Payer: BLUE CROSS/BLUE SHIELD

## 2014-12-25 DIAGNOSIS — Z0000 Encounter for general adult medical examination without abnormal findings: Secondary | ICD-10-CM

## 2014-12-25 LAB — CBC WITH DIFFERENTIAL/PLATELET
Basophils Absolute: 0 10*3/uL (ref 0.0–0.1)
Basophils Relative: 0.4 % (ref 0.0–3.0)
Eosinophils Absolute: 0.3 10*3/uL (ref 0.0–0.7)
Eosinophils Relative: 3.1 % (ref 0.0–5.0)
HEMATOCRIT: 47.1 % (ref 39.0–52.0)
Hemoglobin: 16 g/dL (ref 13.0–17.0)
Lymphocytes Relative: 22 % (ref 12.0–46.0)
Lymphs Abs: 2.4 10*3/uL (ref 0.7–4.0)
MCHC: 33.9 g/dL (ref 30.0–36.0)
MCV: 92.8 fl (ref 78.0–100.0)
MONO ABS: 0.8 10*3/uL (ref 0.1–1.0)
Monocytes Relative: 7.5 % (ref 3.0–12.0)
NEUTROS PCT: 67 % (ref 43.0–77.0)
Neutro Abs: 7.3 10*3/uL (ref 1.4–7.7)
PLATELETS: 284 10*3/uL (ref 150.0–400.0)
RBC: 5.08 Mil/uL (ref 4.22–5.81)
RDW: 14.2 % (ref 11.5–15.5)
WBC: 10.9 10*3/uL — AB (ref 4.0–10.5)

## 2014-12-25 LAB — LIPID PANEL
CHOL/HDL RATIO: 3
CHOLESTEROL: 255 mg/dL — AB (ref 0–200)
HDL: 84.2 mg/dL (ref >39.00)
LDL CALC: 159 mg/dL — AB (ref 0–99)
NonHDL: 170.8
Triglycerides: 57 mg/dL (ref 0.0–149.0)
VLDL: 11.4 mg/dL (ref 0.0–40.0)

## 2014-12-25 LAB — COMPREHENSIVE METABOLIC PANEL
ALT: 30 U/L (ref 0–53)
AST: 25 U/L (ref 0–37)
Albumin: 4.5 g/dL (ref 3.5–5.2)
Alkaline Phosphatase: 62 U/L (ref 39–117)
BUN: 16 mg/dL (ref 6–23)
CALCIUM: 9.7 mg/dL (ref 8.4–10.5)
CHLORIDE: 102 meq/L (ref 96–112)
CO2: 30 mEq/L (ref 19–32)
CREATININE: 1.06 mg/dL (ref 0.40–1.50)
GFR: 86.78 mL/min (ref >60.00)
GLUCOSE: 99 mg/dL (ref 70–99)
POTASSIUM: 4 meq/L (ref 3.5–5.1)
Sodium: 138 mEq/L (ref 135–145)
Total Bilirubin: 0.6 mg/dL (ref 0.2–1.2)
Total Protein: 7 g/dL (ref 6.0–8.3)

## 2014-12-25 LAB — TSH: TSH: 1.17 u[IU]/mL (ref 0.35–4.50)

## 2014-12-31 ENCOUNTER — Ambulatory Visit (INDEPENDENT_AMBULATORY_CARE_PROVIDER_SITE_OTHER): Payer: BLUE CROSS/BLUE SHIELD | Admitting: Family Medicine

## 2014-12-31 ENCOUNTER — Encounter: Payer: Self-pay | Admitting: Family Medicine

## 2014-12-31 VITALS — BP 124/82 | HR 86 | Temp 98.3°F | Ht 68.0 in | Wt 193.8 lb

## 2014-12-31 DIAGNOSIS — E785 Hyperlipidemia, unspecified: Secondary | ICD-10-CM

## 2014-12-31 DIAGNOSIS — D72829 Elevated white blood cell count, unspecified: Secondary | ICD-10-CM

## 2014-12-31 DIAGNOSIS — F17200 Nicotine dependence, unspecified, uncomplicated: Secondary | ICD-10-CM

## 2014-12-31 DIAGNOSIS — Z720 Tobacco use: Secondary | ICD-10-CM

## 2014-12-31 DIAGNOSIS — Z0000 Encounter for general adult medical examination without abnormal findings: Secondary | ICD-10-CM

## 2014-12-31 MED ORDER — VARENICLINE TARTRATE 1 MG PO TABS: 1.0000 mg | ORAL_TABLET | Freq: Two times a day (BID) | ORAL | Status: DC

## 2014-12-31 MED ORDER — VARENICLINE TARTRATE 0.5 MG X 11 & 1 MG X 42 PO MISC: ORAL | Status: DC

## 2014-12-31 NOTE — Assessment & Plan Note (Signed)
Reviewed health habits including diet and exercise and skin cancer prevention Reviewed appropriate screening tests for age  Also reviewed health mt list, fam hx and immunization status , as well as social and family history   Reviewed labs  Disc starting exercise program and effort to quit smoking

## 2014-12-31 NOTE — Patient Instructions (Signed)
Blood pressure looks good  White blood cell count is improved Cholesterol is improved but still high   (Avoid red meat/ fried foods/ egg yolks/ fatty breakfast meats/ butter, cheese and high fat dairy/ and shellfish )  Eat more fiber (fruit and veg)  Try to establish an exercise routine  Work on quitting smoking    Here is a px for chantix - to call around and get prices

## 2014-12-31 NOTE — Progress Notes (Signed)
Pre visit review using our clinic review tool, if applicable. No additional management support is needed unless otherwise documented below in the visit note. 

## 2014-12-31 NOTE — Assessment & Plan Note (Signed)
Disc goals for lipids and reasons to control them Rev labs with pt Rev low sat fat diet in detail Overall improved -with high HDL Disc goal for LDL and handout given on diet

## 2014-12-31 NOTE — Assessment & Plan Note (Signed)
Disc in detail risks of smoking and possible outcomes including copd, vascular/ heart disease, cancer , respiratory and sinus infections  Pt voices understanding Pt is interested in chantix if it is affordable  Given px for starter pack and mt - with handout on the drug itself and potential side eff (disc) He has failed patches and gum in the past
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
## 2015-07-14 ENCOUNTER — Telehealth: Payer: Self-pay

## 2015-07-14 ENCOUNTER — Other Ambulatory Visit: Payer: Self-pay

## 2015-07-14 MED ORDER — VARENICLINE TARTRATE 1 MG PO TABS: 1.0000 mg | ORAL_TABLET | Freq: Two times a day (BID) | ORAL | Status: DC

## 2015-07-14 MED ORDER — VARENICLINE TARTRATE 0.5 MG X 11 & 1 MG X 42 PO MISC: ORAL | Status: DC

## 2015-07-14 NOTE — Telephone Encounter (Signed)
See other 07/14/15 phonenote.

## 2015-07-14 NOTE — Telephone Encounter (Signed)
Pt request refill chantix starter pak and monthly chantix rx; when rx were written in 12/31/14 pt took rx to [facility] but at that time was too expensive and pt does not know what he did with printed rx. Pt request med sent to [facility].

## 2015-07-14 NOTE — Telephone Encounter (Signed)
Left voicemail letting pt know Rx sent to pharmacy, the Starter Rx faxed to pharmacy

## 2015-07-14 NOTE — Telephone Encounter (Signed)
Px written for call in   

## 2015-07-14 NOTE — Telephone Encounter (Signed)
Pt left v/m requesting refill; unable to understand the name of med requested and left v/m for pt to cb.

## 2015-07-23 ENCOUNTER — Telehealth: Payer: Self-pay | Admitting: Family Medicine

## 2015-07-24 ENCOUNTER — Telehealth: Payer: Self-pay

## 2015-07-24 NOTE — Telephone Encounter (Signed)
Pt notified of Dr. Marliss Coots comments. He will stop it and see how he feels by his appt. But he said his sxs are better today so his body might be getting use to med

## 2015-07-24 NOTE — Telephone Encounter (Signed)
Pt returned my call refused to schedule appt today due to his schedule but appt scheduled next week for 07/28/15

## 2015-07-24 NOTE — Telephone Encounter (Signed)
Left voicemail requesting pt to call office back 

## 2015-07-24 NOTE — Telephone Encounter (Signed)
PLEASE NOTE: All timestamps contained within this report are represented as [place] Standard Time. CONFIDENTIALTY NOTICE: This fax transmission is intended only for the addressee. It contains information that is legally privileged, confidential or otherwise protected from use or disclosure. If you are not the intended recipient, you are strictly prohibited from reviewing, disclosing, copying using or disseminating any of this information or taking any action in reliance on or regarding this information. If you have received this fax in error, please notify us immediately by telephone so that we can arrange for its return to [place]. Phone: [phone], Toll-Free: [phone], Fax: [phone] Page: 1 of 2 Call Id: [id number] [place] Patient Name: Lance Foley Gender: Male DOB: September 03, 1986 Age: 29 Y 3 M 9 D Return Phone Number: [phone] (Primary) Address: City/State/Zip: [place] [facility] Client Client Site [facility], [place] Contact Type Call Call Type Triage / Clinical Relationship To Patient Self Appointment Disposition EMR Appointment Not Necessary Info pasted into Epic No Return Phone Number [phone] (Primary) Chief Complaint Depression Initial Comment Caller states day 3 of Chantix, having some depression PreDisposition Did not know what to do Nurse Assessment Nurse: Dimas Chyle, RN, Dellis Filbert Date/Time Eilene Ghazi Time): 07/23/2015 5:14:50 PM Confirm and document reason for call. If symptomatic, describe symptoms. ---Caller states day 3 of Chantix, having some depression. Started on Tuesday. Has the patient traveled out of the country within the last 30 days? ---No Does the patient require triage? ---Yes Related visit to physician within the last 2 weeks? ---Yes Does the PT have any chronic conditions? (i.e.  diabetes, asthma, etc.) ---No Guidelines Guideline Title Affirmed Question Affirmed Notes Nurse Date/Time (Eastern Time) Depression [1] Depression AND [2] worsening (e.g.,sleeping poorly, less able to do activities of daily living) [place], [place], Dellis Filbert 07/23/2015 5:16:22 PM Disp. Time Eilene Ghazi Time) Disposition Final User 07/23/2015 5:17:58 PM See Physician within 24 Hours Yes Dimas Chyle, RN, Guerry Minors Understands: Yes Disagree/Comply: Comply Care Advice Given Per Guideline PLEASE NOTE: All timestamps contained within this report are represented as [place] Standard Time. CONFIDENTIALTY NOTICE: This fax transmission is intended only for the addressee. It contains information that is legally privileged, confidential or otherwise protected from use or disclosure. If you are not the intended recipient, you are strictly prohibited from reviewing, disclosing, copying using or disseminating any of this information or taking any action in reliance on or regarding this information. If you have received this fax in error, please notify us immediately by telephone so that we can arrange for its return to [place]. Phone: [phone], Toll-Free: [phone], Fax: [phone] Page: 2 of 2 Call Id: [id number] Care Advice Given Per Guideline SEE PHYSICIAN WITHIN 24 HOURS: * IF OFFICE WILL BE OPEN: You need to be seen within the next 24 hours. Call your doctor when the office opens, and make an appointment. REASSURANCE: * People with depression do get through this -- even people who feel as badly as you feel now. You can be helped. * Encourage the caller to talk about his/her problems and feelings. * Offer hope. CALL BACK IF: * You feel like harming yourself * You become worse. CARE ADVICE given per Depression (Adult) guideline. After Care Instructions Given Call Event Type User Date / Time Description Referrals REFERRED TO PCP OFFICE

## 2015-07-24 NOTE — Telephone Encounter (Signed)
Pt does not have scheduled appt and unable to reach pt by phone.

## 2015-07-24 NOTE — Telephone Encounter (Signed)
If chantix is causing him to feel depressed -please stop it and update me in several days re: if feeling better If suicidal go to [facility]

## 2015-07-28 ENCOUNTER — Encounter: Payer: Self-pay | Admitting: Family Medicine

## 2015-07-28 ENCOUNTER — Ambulatory Visit (INDEPENDENT_AMBULATORY_CARE_PROVIDER_SITE_OTHER): Payer: BLUE CROSS/BLUE SHIELD | Admitting: Family Medicine

## 2015-07-28 VITALS — BP 135/88 | HR 84 | Temp 98.0°F | Ht 68.0 in | Wt 205.2 lb

## 2015-07-28 DIAGNOSIS — F17200 Nicotine dependence, unspecified, uncomplicated: Secondary | ICD-10-CM

## 2015-07-28 DIAGNOSIS — F419 Anxiety disorder, unspecified: Secondary | ICD-10-CM

## 2015-07-28 DIAGNOSIS — Z720 Tobacco use: Secondary | ICD-10-CM

## 2015-07-28 NOTE — Assessment & Plan Note (Signed)
Trying to quit (no help with nic patch in the past) On chantix and had a day of depressed mood last week which resolved  Some weird dreams-tolerating Some irritability (when med wears off)- tolerating (does not want to stop for this Still craving cigarettes but pt is just starting day 1 of mt tx   Discussed expectations of this  medication including time to effectiveness and mechanism of action, also poss of side effects (early and late)- including mental fuzziness, weight or appetite change, nausea and poss of worse dep or anxiety (even suicidal thoughts)  Pt voiced understanding and will stop med and update if this occurs    Will continue to follow   Will give it 10-14 more days- if not feeling less nicotine craving or if continued side eff he will stop it

## 2015-07-28 NOTE — Patient Instructions (Signed)
Continue chantix at 1 mg twice daily  If you develop worse mood (irritability or depression) at any time- please stop it  Think about a smoking quit date for about 10 days If chantix does not help you quit in the next 2 weeks-also stop it and let me know   Work on an exercise program -this will help mood and craving for cigarettes   When you are done with chantix - we can discuss other options for anxiety and social anxiety treatment

## 2015-07-28 NOTE — Assessment & Plan Note (Signed)
With social anx symptoms Also on chantix trying to quit smoking Reviewed stressors/ coping techniques/symptoms/ support sources/ tx options and side effects in detail today  Will watch for extra irritability   When done with chantix may consider re try of ssri (did not tol zoloft)  He is not interested on counseling
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
## 2015-07-28 NOTE — Progress Notes (Signed)
Pre visit review using our clinic review tool, if applicable. No additional management support is needed unless otherwise documented below in the visit note. 

## 2015-08-18 ENCOUNTER — Telehealth: Payer: Self-pay

## 2015-08-18 MED ORDER — BUPROPION HCL ER (XL) 150 MG PO TB24: 150.0000 mg | ORAL_TABLET | Freq: Every day | ORAL | Status: DC

## 2015-08-18 NOTE — Telephone Encounter (Signed)
Let's try wellbutrin xl- I will send to his pharmacy   If side eff or worse anxiety or depression please let me know (and stop it)  Take one pill each am   Good luck with smoking cessation

## 2015-08-18 NOTE — Telephone Encounter (Signed)
Pt notified Rx sent to pharmacy and pt advise of Dr. Marliss Coots instructions and verbalized understanding

## 2015-08-18 NOTE — Telephone Encounter (Signed)
Has he tried taking wellbutrin in the past?  I know that zoloft did not work out for him- anything else he has taken in the past that had good or bad results?

## 2015-08-18 NOTE — Telephone Encounter (Signed)
Pt left v/m requesting cb about medication. Left v/m requesting cb.

## 2015-08-18 NOTE — Telephone Encounter (Signed)
Pt hasn't tried wellbutrin before but he is willing to try it if you think it will help, pt has tried a reall small dose of prozac in the past but it was so long ago he isn't sure if it helped but he doesn't think it did

## 2015-08-18 NOTE — Telephone Encounter (Signed)
Pt called back; Chantix is not working and pt wants to know if can try an antidepressant to keep pt in positive mood while trying to stop smoking. [facility]. Pt request cb. Pt seen 07/28/15.

## 2015-08-26 ENCOUNTER — Telehealth: Payer: Self-pay

## 2015-08-26 MED ORDER — BUPROPION HCL ER (XL) 300 MG PO TB24: 300.0000 mg | ORAL_TABLET | Freq: Every day | ORAL | Status: DC

## 2015-08-26 NOTE — Telephone Encounter (Signed)
Rx sent to pharmacy and pt notified of increase dose and to update [place] if any issues/problems and pt verbalized understanding

## 2015-08-26 NOTE — Telephone Encounter (Signed)
We can go up to the 300 mg dose  Please call it in  Let me know if any issues or problems

## 2015-08-26 NOTE — Telephone Encounter (Signed)
Pt called stating he has been taking Wellbutrin 150 ER and notices that the medication is not as effective starting midday. Pt wants to know if it can be adjusted or dose increased to cover the entire day--please advise

## 2015-09-09 ENCOUNTER — Encounter: Payer: Self-pay | Admitting: Family Medicine

## 2015-09-09 ENCOUNTER — Ambulatory Visit (INDEPENDENT_AMBULATORY_CARE_PROVIDER_SITE_OTHER): Payer: BLUE CROSS/BLUE SHIELD | Admitting: Family Medicine

## 2015-09-09 VITALS — BP 144/98 | HR 85 | Temp 97.8°F | Ht 68.0 in | Wt 205.2 lb

## 2015-09-09 DIAGNOSIS — Z720 Tobacco use: Secondary | ICD-10-CM

## 2015-09-09 DIAGNOSIS — L500 Allergic urticaria: Secondary | ICD-10-CM | POA: Diagnosis not present

## 2015-09-09 DIAGNOSIS — F419 Anxiety disorder, unspecified: Secondary | ICD-10-CM | POA: Diagnosis not present

## 2015-09-09 DIAGNOSIS — F17200 Nicotine dependence, unspecified, uncomplicated: Secondary | ICD-10-CM
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
## 2015-09-09 NOTE — Patient Instructions (Signed)
Get zyrtec 10 mg over the counter (store brand is fine) and take it at night once daily  This should help hives Stay cool (heat makes hives worse) I suspect you are either allergic to wellbutrin or the nicotine patch - if no improvement in the next week- change from the patch to lozenges or gum  If mood issues get worse- let me know and we can set you up with psychiatrist (who px medication) and a counselor (for talk therapy)

## 2015-09-09 NOTE — Progress Notes (Signed)
Pre visit review using our clinic review tool, if applicable. No additional management support is needed unless otherwise documented below in the visit note. 

## 2015-09-10 NOTE — Assessment & Plan Note (Signed)
Pt was more anxious on higher dose wellbutrin but feels better now  Has been intol to ssri in the past  He is going to stay off med for now , however I urged him to consider counseling and /or psychiatry consult if symptoms worsen He has had lifelong anx/ OCD symptoms  Reviewed stressors/ coping techniques/symptoms/ support sources/ tx options and side effects in detail today

## 2015-09-10 NOTE — Assessment & Plan Note (Signed)
Suspect either allergic rxn to wellbutrin or nicotine patch He stopped wellbutrin  Wants to continue patch to see if it gets better - urged to take it off if symptoms continue  No symptoms or anaphylaxis  inst to get zyrtec and take 10 mg daily for the hives and itching Benadryl is also ok Update if not starting to improve in a week or if worsening

## 2015-09-10 NOTE — Assessment & Plan Note (Signed)
Disc in detail risks of smoking and possible outcomes including copd, vascular/ heart disease, cancer , respiratory and sinus infections  Pt voices understanding Pt is motivated to quit and down to 2-3 cig per day with patch Will have to stop that if we determine it is the cause of his urticaria - could try lozenge or gum instead  Commended

## 2015-12-14 ENCOUNTER — Encounter: Payer: BLUE CROSS/BLUE SHIELD | Admitting: Family Medicine

## 2015-12-27 ENCOUNTER — Telehealth: Payer: Self-pay | Admitting: Family Medicine

## 2015-12-27 DIAGNOSIS — Z0000 Encounter for general adult medical examination without abnormal findings: Secondary | ICD-10-CM

## 2015-12-27 NOTE — Telephone Encounter (Signed)
-----   Message from Marchia Bond sent at 12/22/2015  1:43 PM EST ----- Regarding: Cpx labs Mon 2/20, need orders. Thanks! :-) Please order  future cpx labs for pt's upcoming lab appt. Thanks Aniceto Boss

## 2015-12-28 ENCOUNTER — Other Ambulatory Visit (INDEPENDENT_AMBULATORY_CARE_PROVIDER_SITE_OTHER): Payer: BLUE CROSS/BLUE SHIELD

## 2015-12-28 DIAGNOSIS — Z0000 Encounter for general adult medical examination without abnormal findings: Secondary | ICD-10-CM | POA: Diagnosis not present

## 2015-12-28 LAB — COMPREHENSIVE METABOLIC PANEL
ALT: 63 U/L — AB (ref 0–53)
AST: 40 U/L — AB (ref 0–37)
Albumin: 4.3 g/dL (ref 3.5–5.2)
Alkaline Phosphatase: 90 U/L (ref 39–117)
BILIRUBIN TOTAL: 0.4 mg/dL (ref 0.2–1.2)
BUN: 22 mg/dL (ref 6–23)
CALCIUM: 9.6 mg/dL (ref 8.4–10.5)
CHLORIDE: 101 meq/L (ref 96–112)
CO2: 29 meq/L (ref 19–32)
CREATININE: 1.05 mg/dL (ref 0.40–1.50)
GFR: 87.16 mL/min (ref >60.00)
GLUCOSE: 104 mg/dL — AB (ref 70–99)
Potassium: 4.1 mEq/L (ref 3.5–5.1)
SODIUM: 139 meq/L (ref 135–145)
Total Protein: 6.8 g/dL (ref 6.0–8.3)

## 2015-12-28 LAB — CBC WITH DIFFERENTIAL/PLATELET
BASOS ABS: 0.1 10*3/uL (ref 0.0–0.1)
BASOS PCT: 0.5 % (ref 0.0–3.0)
EOS ABS: 0.4 10*3/uL (ref 0.0–0.7)
Eosinophils Relative: 3 % (ref 0.0–5.0)
HCT: 48.8 % (ref 39.0–52.0)
Hemoglobin: 16.3 g/dL (ref 13.0–17.0)
LYMPHS ABS: 2.7 10*3/uL (ref 0.7–4.0)
LYMPHS PCT: 20.8 % (ref 12.0–46.0)
MCHC: 33.3 g/dL (ref 30.0–36.0)
MCV: 91.5 fl (ref 78.0–100.0)
MONO ABS: 1.2 10*3/uL — AB (ref 0.1–1.0)
Monocytes Relative: 9.6 % (ref 3.0–12.0)
NEUTROS ABS: 8.4 10*3/uL — AB (ref 1.4–7.7)
NEUTROS PCT: 66.1 % (ref 43.0–77.0)
PLATELETS: 328 10*3/uL (ref 150.0–400.0)
RBC: 5.34 Mil/uL (ref 4.22–5.81)
RDW: 16.2 % — AB (ref 11.5–15.5)
WBC: 12.8 10*3/uL — ABNORMAL HIGH (ref 4.0–10.5)

## 2015-12-28 LAB — LIPID PANEL
CHOL/HDL RATIO: 5
Cholesterol: 262 mg/dL — ABNORMAL HIGH (ref 0–200)
HDL: 58.2 mg/dL (ref >39.00)
LDL CALC: 172 mg/dL — AB (ref 0–99)
NONHDL: 203.98
TRIGLYCERIDES: 161 mg/dL — AB (ref 0.0–149.0)
VLDL: 32.2 mg/dL (ref 0.0–40.0)

## 2015-12-28 LAB — TSH: TSH: 2.05 u[IU]/mL (ref 0.35–4.50)

## 2015-12-31 ENCOUNTER — Other Ambulatory Visit: Payer: BLUE CROSS/BLUE SHIELD

## 2016-01-04 ENCOUNTER — Encounter: Payer: Self-pay | Admitting: Family Medicine

## 2016-01-04 ENCOUNTER — Ambulatory Visit (INDEPENDENT_AMBULATORY_CARE_PROVIDER_SITE_OTHER): Payer: BLUE CROSS/BLUE SHIELD | Admitting: Family Medicine

## 2016-01-04 VITALS — BP 138/95 | HR 89 | Temp 98.0°F | Ht 69.0 in | Wt 204.0 lb

## 2016-01-04 DIAGNOSIS — E349 Endocrine disorder, unspecified: Secondary | ICD-10-CM

## 2016-01-04 DIAGNOSIS — E785 Hyperlipidemia, unspecified: Secondary | ICD-10-CM | POA: Diagnosis not present

## 2016-01-04 DIAGNOSIS — R7401 Elevation of levels of liver transaminase levels: Secondary | ICD-10-CM

## 2016-01-04 DIAGNOSIS — Z0000 Encounter for general adult medical examination without abnormal findings: Secondary | ICD-10-CM

## 2016-01-04 DIAGNOSIS — Z720 Tobacco use: Secondary | ICD-10-CM | POA: Diagnosis not present

## 2016-01-04 DIAGNOSIS — R740 Nonspecific elevation of levels of transaminase and lactic acid dehydrogenase [LDH]: Secondary | ICD-10-CM

## 2016-01-04 DIAGNOSIS — D72829 Elevated white blood cell count, unspecified: Secondary | ICD-10-CM

## 2016-01-04 DIAGNOSIS — F17200 Nicotine dependence, unspecified, uncomplicated: Secondary | ICD-10-CM

## 2016-01-04 DIAGNOSIS — F419 Anxiety disorder, unspecified: Secondary | ICD-10-CM

## 2016-01-04 DIAGNOSIS — E669 Obesity, unspecified: Secondary | ICD-10-CM | POA: Diagnosis not present

## 2016-01-04 DIAGNOSIS — R739 Hyperglycemia, unspecified: Secondary | ICD-10-CM

## 2016-01-04 DIAGNOSIS — R7303 Prediabetes: Secondary | ICD-10-CM | POA: Insufficient documentation

## 2016-01-04 DIAGNOSIS — E291 Testicular hypofunction: Secondary | ICD-10-CM

## 2016-01-04 DIAGNOSIS — IMO0001 Reserved for inherently not codable concepts without codable children: Secondary | ICD-10-CM

## 2016-01-04 DIAGNOSIS — R030 Elevated blood-pressure reading, without diagnosis of hypertension: Secondary | ICD-10-CM

## 2016-01-04 NOTE — Assessment & Plan Note (Signed)
Disc in detail risks of smoking and possible outcomes including copd, vascular/ heart disease, cancer , respiratory and sinus infections  Pt voices understanding Intol of chantix All to wellbutrin Not int in nicotine repl Will try to quit when he is ready

## 2016-01-04 NOTE — Assessment & Plan Note (Signed)
Fasting glucose 104 Enc to cut carbs and sugar in diet-he is motivated to do this and exercise

## 2016-01-04 NOTE — Assessment & Plan Note (Signed)
Reviewed stressors/ coping techniques/symptoms/ support sources/ tx options and side effects in detail today Just went through a breakup Does not desire counseling Has good support Does not desire medicine at this time Stressed that smoking and etoh are not good coping tech-he agrees  Will work on exercise/self care and better habits F/u 2 mo

## 2016-01-04 NOTE — Progress Notes (Signed)
Pre visit review using our clinic review tool, if applicable. No additional management support is needed unless otherwise documented below in the visit note. 

## 2016-01-04 NOTE — Assessment & Plan Note (Signed)
This is up  Pt blames poor diet and habits  Also smoker with elevated bp Disc goals for lipids and reasons to control them Rev labs with pt Rev low sat fat diet in detail  May consider med in the future if not improved

## 2016-01-04 NOTE — Assessment & Plan Note (Signed)
Pt blames poor habits Poss also testosterone  Discussed how this problem influences overall health and the risks it imposes  Reviewed plan for weight loss with lower calorie diet (via better food choices and also portion control or program like weight watchers) and exercise building up to or more than 30 minutes 5 days per week including some aerobic activity

## 2016-01-04 NOTE — Assessment & Plan Note (Signed)
Poss early HTN Handout on DASH diet and he is getting back on track with exercise Stressed imp of limiting etoh/ and also eventually quitting smoking  F/u 2 mo  Consider tx if necessary Labs rev  Wt loss enc

## 2016-01-04 NOTE — Assessment & Plan Note (Signed)
Reviewed health habits including diet and exercise and skin cancer prevention Reviewed appropriate screening tests for age  Also reviewed health mt list, fam hx and immunization status , as well as social and family history   See HPI Labs reviewed Blood pressure is elevated-work on diet/exercise /healthy habits  Take a look at the [facility] eating plan  Liver tests are up a bit - try to cut alcohol by at least 1 drink per day/the less the better and avoid acetaminophen (tylenol)  Do take a copy of labs to urology Also try to cut some sweets- sugar is up a bit  When you are ready-work on the smoking (white blood cell count is still mildly elevated)  Follow up in about 2 months with labs prior - checking sugar/ cholesterol/liver  This will give you a chance to work on your habits - one thing at a time
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
## 2016-01-04 NOTE — Assessment & Plan Note (Signed)
May be multifactorial- recent inc in etoh as well as recent hydrocodone apap for knees and also on testosterone with 11 lb wt gain in a year  Disc cutting etoh Low fat diet  Stop acetaminophen  F/u 2 mo and re check  Update if any GI symptoms

## 2016-01-04 NOTE — Patient Instructions (Addendum)
Blood pressure is elevated-work on diet/exercise /healthy habits  Take a look at the [facility] eating plan  Liver tests are up a bit - try to cut alcohol by at least 1 drink per day/the less the better and avoid acetaminophen (tylenol)  Do take a copy of labs to urology Also try to cut some sweets- sugar is up a bit  When you are ready-work on the smoking (white blood cell count is still mildly elevated)  Follow up in about 2 months with labs prior - checking sugar/ cholesterol/liver  This will give you a chance to work on your habits - one thing at a time

## 2016-01-04 NOTE — Assessment & Plan Note (Signed)
On medication- helping clinically  Unsure if inc bp/ glucose/chol or liver tests are related He will take copy of labs to urologist next mo

## 2016-03-03 ENCOUNTER — Other Ambulatory Visit (INDEPENDENT_AMBULATORY_CARE_PROVIDER_SITE_OTHER): Payer: BLUE CROSS/BLUE SHIELD

## 2016-03-03 DIAGNOSIS — R739 Hyperglycemia, unspecified: Secondary | ICD-10-CM

## 2016-03-03 DIAGNOSIS — E785 Hyperlipidemia, unspecified: Secondary | ICD-10-CM | POA: Diagnosis not present

## 2016-03-03 DIAGNOSIS — R740 Nonspecific elevation of levels of transaminase and lactic acid dehydrogenase [LDH]: Secondary | ICD-10-CM

## 2016-03-03 DIAGNOSIS — R7401 Elevation of levels of liver transaminase levels: Secondary | ICD-10-CM

## 2016-03-03 LAB — LIPID PANEL
CHOL/HDL RATIO: 4
Cholesterol: 255 mg/dL — ABNORMAL HIGH (ref 0–200)
HDL: 62.5 mg/dL (ref >39.00)
LDL CALC: 170 mg/dL — AB (ref 0–99)
NONHDL: 192.13
TRIGLYCERIDES: 113 mg/dL (ref 0.0–149.0)
VLDL: 22.6 mg/dL (ref 0.0–40.0)

## 2016-03-03 LAB — HEPATIC FUNCTION PANEL
ALT: 60 U/L — ABNORMAL HIGH (ref 0–53)
AST: 34 U/L (ref 0–37)
Albumin: 4.5 g/dL (ref 3.5–5.2)
Alkaline Phosphatase: 79 U/L (ref 39–117)
BILIRUBIN DIRECT: 0.1 mg/dL (ref 0.0–0.3)
TOTAL PROTEIN: 7.1 g/dL (ref 6.0–8.3)
Total Bilirubin: 0.5 mg/dL (ref 0.2–1.2)

## 2016-03-03 LAB — HEMOGLOBIN A1C: Hgb A1c MFr Bld: 6 % (ref 4.6–6.5)

## 2016-03-07 ENCOUNTER — Encounter: Payer: Self-pay | Admitting: Family Medicine

## 2016-03-07 ENCOUNTER — Ambulatory Visit (INDEPENDENT_AMBULATORY_CARE_PROVIDER_SITE_OTHER): Payer: BLUE CROSS/BLUE SHIELD | Admitting: Family Medicine

## 2016-03-07 VITALS — BP 134/86 | HR 84 | Temp 98.3°F | Ht 69.0 in | Wt 198.8 lb

## 2016-03-07 DIAGNOSIS — R030 Elevated blood-pressure reading, without diagnosis of hypertension: Secondary | ICD-10-CM

## 2016-03-07 DIAGNOSIS — E785 Hyperlipidemia, unspecified: Secondary | ICD-10-CM | POA: Diagnosis not present

## 2016-03-07 DIAGNOSIS — Z720 Tobacco use: Secondary | ICD-10-CM

## 2016-03-07 DIAGNOSIS — IMO0001 Reserved for inherently not codable concepts without codable children: Secondary | ICD-10-CM

## 2016-03-07 DIAGNOSIS — F17200 Nicotine dependence, unspecified, uncomplicated: Secondary | ICD-10-CM

## 2016-03-07 DIAGNOSIS — R739 Hyperglycemia, unspecified: Secondary | ICD-10-CM

## 2016-03-07 DIAGNOSIS — R7401 Elevation of levels of liver transaminase levels: Secondary | ICD-10-CM

## 2016-03-07 DIAGNOSIS — R740 Nonspecific elevation of levels of transaminase and lactic acid dehydrogenase [LDH]: Secondary | ICD-10-CM | POA: Diagnosis not present
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
## 2016-03-07 NOTE — Assessment & Plan Note (Signed)
Lab Results  Component Value Date   HGBA1C 6.0 03/03/2016   Will watch this He is loosing wt and cutting down on sugar Commended F/u 6 mo

## 2016-03-07 NOTE — Progress Notes (Signed)
Pre visit review using our clinic review tool, if applicable. No additional management support is needed unless otherwise documented below in the visit note. 

## 2016-03-07 NOTE — Assessment & Plan Note (Signed)
BP is improved  Disc DASH diet  Wt loss/exercise are helping  Enc smoking cessation F/u 6 mo

## 2016-03-07 NOTE — Patient Instructions (Signed)
Schedule f/u in 6 months with labs prior  Keep working on low sugar diet and weight loss For cholesterol (Avoid red meat/ fried foods/ egg yolks/ fatty breakfast meats/ butter, cheese and high fat dairy/ and shellfish) Think about quitting smoking and quit when you are ready  Minimize alcohol as much as you can - we will watch liver tests   I'm glad you are doing better

## 2016-03-07 NOTE — Assessment & Plan Note (Signed)
Disc goals for lipids and reasons to control them Rev labs with pt Rev low sat fat diet in detail LDL is still high-though HDL and trig improved  May be hereditary Given handout -will work on diet/re check 6 mo /consider statin if not at goal Goal is LDL under 100 since he smokes

## 2016-03-07 NOTE — Assessment & Plan Note (Signed)
Improved with no APAP and less etoh Adv to minimize etoh Also lower fat diet  Re check at 6 mo f/u

## 2016-03-07 NOTE — Assessment & Plan Note (Signed)
Disc in detail risks of smoking and possible outcomes including copd, vascular/ heart disease, cancer , respiratory and sinus infections  Pt voices understanding He is not ready to quit yet

## 2016-03-25 ENCOUNTER — Telehealth: Payer: Self-pay

## 2016-03-25 MED ORDER — PREDNISONE 10 MG PO TABS: ORAL_TABLET | ORAL | Status: DC

## 2016-03-25 NOTE — Telephone Encounter (Signed)
Left detailed voicemail asking pt to return my call and answer the questions Dr. Glori Bickers stated in prev message

## 2016-03-25 NOTE — Telephone Encounter (Signed)
Warn him that prednisone can make him hyper and hungry and also raise blood glucose levels/ decrease ability to fight infection Be cautious and f/u if worse or not imp or s/s of infection I sent a prednisone taper to his pharmacy

## 2016-03-25 NOTE — Telephone Encounter (Signed)
Pt has poison oak on rt arm, shoulder,inner thigh;pt said poison oak is spreading and request prednisone to [facility]. Pt said due to work cannot schedule appt; offered Sat [facility] but pt will be working then also; pt does not want to wait until next week;" poison oak will be too far gone." Pt request cb. Pt seen 03/07/16 for F/U; pt has not tried OTC med.

## 2016-03-25 NOTE — Telephone Encounter (Signed)
Patient returned Shapale's call.  Patient said it's red,small spots in clusters and some are individual. Patient tried Cortisone. No signs of pus or infection. He did wash everything.  Patient can be called back at [phone].

## 2016-03-25 NOTE — Telephone Encounter (Signed)
What does it look like?  Blisters?  What did he use otc?  Any pus or signs of infection?  Did he wash everything that touched the plant (clothes/shoes/hats)- to he does not re contaminate himself?

## 2016-03-25 NOTE — Telephone Encounter (Signed)
Left detailed voicemail letting pt know Rx sent to pharmacy and I advise him of Dr. Marliss Coots comments and to f/u if no improvement

## 2016-09-05 ENCOUNTER — Other Ambulatory Visit: Payer: BLUE CROSS/BLUE SHIELD

## 2016-09-07 ENCOUNTER — Ambulatory Visit: Payer: BLUE CROSS/BLUE SHIELD | Admitting: Family Medicine

## 2016-09-15 ENCOUNTER — Other Ambulatory Visit (INDEPENDENT_AMBULATORY_CARE_PROVIDER_SITE_OTHER): Payer: BLUE CROSS/BLUE SHIELD

## 2016-09-15 DIAGNOSIS — E7849 Other hyperlipidemia: Secondary | ICD-10-CM

## 2016-09-15 DIAGNOSIS — R740 Nonspecific elevation of levels of transaminase and lactic acid dehydrogenase [LDH]: Secondary | ICD-10-CM | POA: Diagnosis not present

## 2016-09-15 DIAGNOSIS — E784 Other hyperlipidemia: Secondary | ICD-10-CM | POA: Diagnosis not present

## 2016-09-15 DIAGNOSIS — E785 Hyperlipidemia, unspecified: Secondary | ICD-10-CM

## 2016-09-15 DIAGNOSIS — E349 Endocrine disorder, unspecified: Secondary | ICD-10-CM | POA: Diagnosis not present

## 2016-09-15 DIAGNOSIS — R7401 Elevation of levels of liver transaminase levels: Secondary | ICD-10-CM

## 2016-09-15 DIAGNOSIS — E668 Other obesity: Secondary | ICD-10-CM

## 2016-09-15 DIAGNOSIS — R739 Hyperglycemia, unspecified: Secondary | ICD-10-CM | POA: Diagnosis not present

## 2016-09-15 LAB — COMPREHENSIVE METABOLIC PANEL
ALK PHOS: 84 U/L (ref 39–117)
ALT: 48 U/L (ref 0–53)
AST: 34 U/L (ref 0–37)
Albumin: 4.7 g/dL (ref 3.5–5.2)
BUN: 14 mg/dL (ref 6–23)
CO2: 32 meq/L (ref 19–32)
Calcium: 9.8 mg/dL (ref 8.4–10.5)
Chloride: 100 mEq/L (ref 96–112)
Creatinine, Ser: 1.05 mg/dL (ref 0.40–1.50)
GFR: 86.77 mL/min (ref >60.00)
GLUCOSE: 97 mg/dL (ref 70–99)
POTASSIUM: 4 meq/L (ref 3.5–5.1)
SODIUM: 141 meq/L (ref 135–145)
TOTAL PROTEIN: 7.4 g/dL (ref 6.0–8.3)
Total Bilirubin: 0.5 mg/dL (ref 0.2–1.2)

## 2016-09-15 LAB — LIPID PANEL
Cholesterol: 260 mg/dL — ABNORMAL HIGH (ref 0–200)
HDL: 82.5 mg/dL (ref >39.00)
LDL CALC: 156 mg/dL — AB (ref 0–99)
NONHDL: 177.39
TRIGLYCERIDES: 109 mg/dL (ref 0.0–149.0)
Total CHOL/HDL Ratio: 3
VLDL: 21.8 mg/dL (ref 0.0–40.0)

## 2016-09-15 LAB — TESTOSTERONE: TESTOSTERONE: 402.48 ng/dL (ref 300.00–890.00)

## 2016-09-15 LAB — HEMOGLOBIN A1C: Hgb A1c MFr Bld: 5.5 % (ref 4.6–6.5)

## 2016-09-19 ENCOUNTER — Ambulatory Visit (INDEPENDENT_AMBULATORY_CARE_PROVIDER_SITE_OTHER): Payer: BLUE CROSS/BLUE SHIELD | Admitting: Family Medicine

## 2016-09-19 ENCOUNTER — Encounter: Payer: Self-pay | Admitting: Family Medicine

## 2016-09-19 VITALS — BP 128/70 | HR 81 | Temp 98.1°F | Ht 69.0 in | Wt 191.2 lb

## 2016-09-19 DIAGNOSIS — E784 Other hyperlipidemia: Secondary | ICD-10-CM | POA: Diagnosis not present

## 2016-09-19 DIAGNOSIS — R739 Hyperglycemia, unspecified: Secondary | ICD-10-CM | POA: Diagnosis not present

## 2016-09-19 DIAGNOSIS — R7401 Elevation of levels of liver transaminase levels: Secondary | ICD-10-CM

## 2016-09-19 DIAGNOSIS — F17200 Nicotine dependence, unspecified, uncomplicated: Secondary | ICD-10-CM

## 2016-09-19 DIAGNOSIS — R740 Nonspecific elevation of levels of transaminase and lactic acid dehydrogenase [LDH]: Secondary | ICD-10-CM | POA: Diagnosis not present

## 2016-09-19 DIAGNOSIS — E349 Endocrine disorder, unspecified: Secondary | ICD-10-CM

## 2016-09-19 DIAGNOSIS — E7849 Other hyperlipidemia: Secondary | ICD-10-CM

## 2016-09-19 NOTE — Assessment & Plan Note (Signed)
Disc goals for lipids and reasons to control them Rev labs with pt Rev low sat fat diet in detail Some improvement in LDL from 170s to 150s Goal is under 100 in light of smoking Pt aware/ does not want to start med/ and plans to work on smoking cessation  Will also cut back on fatty foods

## 2016-09-19 NOTE — Assessment & Plan Note (Signed)
Lab Results  Component Value Date   HGBA1C 5.5 09/15/2016   This is improved with lower glycemic diet  Enc him to keep up the good work

## 2016-09-19 NOTE — Patient Instructions (Signed)
Goal LDL cholesterol for a smoker is to get it under 100 if possible  Quitting smoking would be the next best step  For cholesterol   Avoid red meat/ fried foods/ egg yolks/ fatty breakfast meats/ butter, cheese and high fat dairy/ and shellfish    Cholesterol, blood sugar, blood pressure are all improved  So are your liver tests   Stop at check out for next app

## 2016-09-19 NOTE — Assessment & Plan Note (Signed)
Disc in detail risks of smoking and possible outcomes including copd, vascular/ heart disease, cancer , respiratory and sinus infections  Pt voices understanding Pt states he is not currently ready to quit but may be soon
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
## 2016-09-19 NOTE — Progress Notes (Signed)
Pre visit review using our clinic review tool, if applicable. No additional management support is needed unless otherwise documented below in the visit note. 

## 2016-09-19 NOTE — Assessment & Plan Note (Signed)
Resolved after stopping acetaminophen containing med  Still drinks 1-2 etoh drinks per day

## 2016-09-19 NOTE — Assessment & Plan Note (Signed)
No longer taking injections Level was in the nl range today  States he feels better

## 2016-09-22 NOTE — Telephone Encounter (Signed)
Opened in error

## 2016-12-25 ENCOUNTER — Telehealth: Payer: Self-pay | Admitting: Family Medicine

## 2016-12-25 DIAGNOSIS — Z0000 Encounter for general adult medical examination without abnormal findings: Secondary | ICD-10-CM

## 2016-12-25 DIAGNOSIS — R739 Hyperglycemia, unspecified: Secondary | ICD-10-CM

## 2016-12-25 NOTE — Telephone Encounter (Signed)
-----   Message from Ellamae Sia sent at 12/19/2016 10:09 AM EST ----- Regarding: Lab orders for 2.19.18 Patient is scheduled for CPX labs, please order future labs, Thanks , Karna Christmas

## 2016-12-26 ENCOUNTER — Other Ambulatory Visit: Payer: BLUE CROSS/BLUE SHIELD

## 2016-12-28 ENCOUNTER — Encounter: Payer: BLUE CROSS/BLUE SHIELD | Admitting: Family Medicine

## 2017-08-21 ENCOUNTER — Other Ambulatory Visit (INDEPENDENT_AMBULATORY_CARE_PROVIDER_SITE_OTHER): Payer: Managed Care, Other (non HMO)

## 2017-08-21 DIAGNOSIS — Z0000 Encounter for general adult medical examination without abnormal findings: Secondary | ICD-10-CM | POA: Diagnosis not present

## 2017-08-21 DIAGNOSIS — E349 Endocrine disorder, unspecified: Secondary | ICD-10-CM

## 2017-08-21 DIAGNOSIS — E668 Other obesity: Secondary | ICD-10-CM

## 2017-08-21 DIAGNOSIS — R739 Hyperglycemia, unspecified: Secondary | ICD-10-CM

## 2017-08-21 LAB — LIPID PANEL
Cholesterol: 243 mg/dL — ABNORMAL HIGH (ref 0–200)
HDL: 62.6 mg/dL (ref >39.00)
LDL Cholesterol: 146 mg/dL — ABNORMAL HIGH (ref 0–99)
NONHDL: 180.69
Total CHOL/HDL Ratio: 4
Triglycerides: 173 mg/dL — ABNORMAL HIGH (ref 0.0–149.0)
VLDL: 34.6 mg/dL (ref 0.0–40.0)

## 2017-08-21 LAB — CBC WITH DIFFERENTIAL/PLATELET
BASOS PCT: 0.8 % (ref 0.0–3.0)
Basophils Absolute: 0.1 10*3/uL (ref 0.0–0.1)
EOS PCT: 3.3 % (ref 0.0–5.0)
Eosinophils Absolute: 0.3 10*3/uL (ref 0.0–0.7)
HCT: 48.7 % (ref 39.0–52.0)
HEMOGLOBIN: 16.8 g/dL (ref 13.0–17.0)
Lymphocytes Relative: 20.3 % (ref 12.0–46.0)
Lymphs Abs: 2.1 10*3/uL (ref 0.7–4.0)
MCHC: 34.6 g/dL (ref 30.0–36.0)
MCV: 100.1 fl — ABNORMAL HIGH (ref 78.0–100.0)
Monocytes Absolute: 0.9 10*3/uL (ref 0.1–1.0)
Monocytes Relative: 8.6 % (ref 3.0–12.0)
Neutro Abs: 6.8 10*3/uL (ref 1.4–7.7)
Neutrophils Relative %: 67 % (ref 43.0–77.0)
Platelets: 255 10*3/uL (ref 150.0–400.0)
RBC: 4.86 Mil/uL (ref 4.22–5.81)
RDW: 13.8 % (ref 11.5–15.5)
WBC: 10.1 10*3/uL (ref 4.0–10.5)

## 2017-08-21 LAB — COMPREHENSIVE METABOLIC PANEL
ALBUMIN: 4.3 g/dL (ref 3.5–5.2)
ALK PHOS: 78 U/L (ref 39–117)
ALT: 107 U/L — AB (ref 0–53)
AST: 72 U/L — ABNORMAL HIGH (ref 0–37)
BILIRUBIN TOTAL: 0.7 mg/dL (ref 0.2–1.2)
BUN: 10 mg/dL (ref 6–23)
CO2: 30 mEq/L (ref 19–32)
Calcium: 9.4 mg/dL (ref 8.4–10.5)
Chloride: 100 mEq/L (ref 96–112)
Creatinine, Ser: 0.94 mg/dL (ref 0.40–1.50)
GFR: 98.02 mL/min (ref >60.00)
Glucose, Bld: 91 mg/dL (ref 70–99)
POTASSIUM: 4.1 meq/L (ref 3.5–5.1)
SODIUM: 139 meq/L (ref 135–145)
TOTAL PROTEIN: 6.9 g/dL (ref 6.0–8.3)

## 2017-08-21 LAB — TSH: TSH: 2.21 u[IU]/mL (ref 0.35–4.50)

## 2017-08-21 LAB — HEMOGLOBIN A1C: HEMOGLOBIN A1C: 5.3 % (ref 4.6–6.5)

## 2017-08-26 LAB — TESTOS,TOTAL,FREE AND SHBG (FEMALE)
Free Testosterone: 55 pg/mL (ref 35.0–155.0)
SEX HORMONE BINDING: 25 nmol/L (ref 10–50)
TESTOSTERONE, TOTAL, LC-MS-MS: 369 ng/dL (ref 250–1100)

## 2017-08-28 ENCOUNTER — Ambulatory Visit (INDEPENDENT_AMBULATORY_CARE_PROVIDER_SITE_OTHER): Payer: Managed Care, Other (non HMO) | Admitting: Family Medicine

## 2017-08-28 ENCOUNTER — Encounter: Payer: Self-pay | Admitting: Family Medicine

## 2017-08-28 VITALS — BP 148/92 | HR 100 | Temp 98.0°F | Ht 68.75 in | Wt 195.5 lb

## 2017-08-28 DIAGNOSIS — R7401 Elevation of levels of liver transaminase levels: Secondary | ICD-10-CM

## 2017-08-28 DIAGNOSIS — F17200 Nicotine dependence, unspecified, uncomplicated: Secondary | ICD-10-CM | POA: Diagnosis not present

## 2017-08-28 DIAGNOSIS — Z0000 Encounter for general adult medical examination without abnormal findings: Secondary | ICD-10-CM | POA: Diagnosis not present

## 2017-08-28 DIAGNOSIS — E7849 Other hyperlipidemia: Secondary | ICD-10-CM

## 2017-08-28 DIAGNOSIS — R739 Hyperglycemia, unspecified: Secondary | ICD-10-CM | POA: Diagnosis not present

## 2017-08-28 DIAGNOSIS — N492 Inflammatory disorders of scrotum: Secondary | ICD-10-CM

## 2017-08-28 DIAGNOSIS — R740 Nonspecific elevation of levels of transaminase and lactic acid dehydrogenase [LDH]: Secondary | ICD-10-CM

## 2017-08-28 NOTE — Assessment & Plan Note (Signed)
Disc goals for lipids and reasons to control them Rev labs with pt Rev low sat fat diet in detail LDL in 140s and pt's diet is not optimal  Good HDL-though down  Given info on diet  Urged to cut back on red meat and fried foods

## 2017-08-28 NOTE — Patient Instructions (Addendum)
For cholesterol    Avoid red meat/ fried foods/ egg yolks/ fatty breakfast meats/ butter, cheese and high fat dairy/ and shellfish   Goal - to cut red meat to once per month  Fried foods - try to eliminate  Fish with fins - very good for you   For weight loss- eliminate fried food and think about an exercise program   Liver tests are up- cut out alcohol for the near future and avoid acetaminophen (tylenol)  Let's re check liver tests in about a month     Continue the antibiotic and the ointment  Update if not starting to improve in a week or if worsening

## 2017-08-28 NOTE — Assessment & Plan Note (Signed)
Disc in detail risks of smoking and possible outcomes including copd, vascular/ heart disease, cancer , respiratory and sinus infections  Pt voices understanding Pt states he is not ready to quit but thinking about it

## 2017-08-28 NOTE — Assessment & Plan Note (Signed)
Reviewed health habits including diet and exercise and skin cancer prevention Reviewed appropriate screening tests for age  Also reviewed health mt list, fam hx and immunization status , as well as social and family history   See HPI Labs reviewed  bp up due to stress/pain today- will follow that  Counseled on etoh use in light of liver tests -will hold all etoh for now  Also counseled on smoking cessation-pt states he is not ready

## 2017-08-28 NOTE — Assessment & Plan Note (Addendum)
AST and ALT are up  Suspect due to etoh (has been in the past)  Not taking acetaminophen  Is on abx  Fatty liver is possible   Will re check 1 mo after holding etoh  Watch for abd pain or other symptoms

## 2017-08-28 NOTE — Assessment & Plan Note (Signed)
Reviewed recent UC note On clinda and bactroban -since sat pm  Per pt no worse  Will continue to follow Update if not starting to improve in a week or if worsening

## 2017-08-28 NOTE — Assessment & Plan Note (Signed)
Lab Results  Component Value Date   HGBA1C 5.3 08/21/2017   Good control with diet  disc imp of low glycemic diet and wt loss to prevent DM2

## 2017-08-28 NOTE — Progress Notes (Signed)
Subjective:    Patient ID: Lance Foley, male    DOB: [birth year], 31 y.o.   MRN: [id number]  HPI  Here for health maintenance exam and to review chronic medical problems    Wt Readings from Last 3 Encounters:  08/28/17 195 lb 8 oz (88.7 kg)  09/19/16 191 lb 4 oz (86.8 kg)  03/07/16 198 lb 12 oz (90.2 kg)  not exercising/ diet is not optimal  29.08 kg/m   Was recently tx for cellulitis of scrotum  Derm-tx with prednisone and steroid cream  (dried skin out) - still pending a culture  Then went to UC-thought it was staph infx from shaving  Given clindamycin - just started on Saturday and the bactroban oint  No fever   Temp: 98 F (36.7 C)    Woke up today with legs aching -both but worse in the R  Just drove 8 hours yesterday -thinks it is muscle fatigue  No swelling  Symptoms are gone now    Cannot tell if improving yet  Not worse  Was out of work -will return fri for regular shift    Tdap 1/15  Flu shot - declines   HIV screen-not interested/low risk   Smoking status - 1ppd  About the same  Not ready to quit  Keeps getting stressed    Hyperglycemia  Lab Results  Component Value Date   HGBA1C 5.3 08/21/2017  good diet-avoids sweets most of the time -does well with that     Hyperlipidemia  Lab Results  Component Value Date   CHOL 243 (H) 08/21/2017   CHOL 260 (H) 09/15/2016   CHOL 255 (H) 03/03/2016   Lab Results  Component Value Date   HDL 62.60 08/21/2017   HDL 82.50 09/15/2016   HDL 62.50 03/03/2016   Lab Results  Component Value Date   LDLCALC 146 (H) 08/21/2017   LDLCALC 156 (H) 09/15/2016   LDLCALC 170 (H) 03/03/2016   Lab Results  Component Value Date   TRIG 173.0 (H) 08/21/2017   TRIG 109.0 09/15/2016   TRIG 113.0 03/03/2016   Lab Results  Component Value Date   CHOLHDL 4 08/21/2017   CHOLHDL 3 09/15/2016   CHOLHDL 4 03/03/2016   Lab Results  Component Value Date   LDLDIRECT 187.2 11/14/2013   LDLDIRECT 109.2 01/30/2008     Not exercising  Would like to start Eats a fair amount of fatty foods/fried foods -twice weekly  Also red meat  It runs in the family     Blood pressure- up due to pain and anxiety BP Readings from Last 3 Encounters:  08/28/17 (!) 148/92  09/19/16 128/70  03/07/16 134/86   Hist of leukocytosis Lab Results  Component Value Date   WBC 10.1 08/21/2017   HGB 16.8 08/21/2017   HCT 48.7 08/21/2017   MCV 100.1 (H) 08/21/2017   PLT 255.0 08/21/2017   Good today  MCV - up  Has seen heme in the past-thought to be from smoking   H/o elevated LFT Lab Results  Component Value Date   ALT 107 (H) 08/21/2017   AST 72 (H) 08/21/2017   ALKPHOS 78 08/21/2017   BILITOT 0.7 08/21/2017     etoh intake  2 drinks per day occ more  It does calm him down  Rarely acetaminophen    Has menieres dz On dyazide Lab Results  Component Value Date   CREATININE 0.94 08/21/2017   BUN 10 08/21/2017   NA 139 08/21/2017  K 4.1 08/21/2017   CL 100 08/21/2017   CO2 30 08/21/2017     Review of Systems  Constitutional: Negative for activity change, appetite change, fatigue, fever and unexpected weight change.  HENT: Negative for congestion, rhinorrhea, sore throat and trouble swallowing.   Eyes: Negative for pain, redness, itching and visual disturbance.  Respiratory: Negative for cough, chest tightness, shortness of breath and wheezing.   Cardiovascular: Negative for chest pain and palpitations.  Gastrointestinal: Negative for abdominal pain, blood in stool, constipation, diarrhea and nausea.  Endocrine: Negative for cold intolerance, heat intolerance, polydipsia and polyuria.  Genitourinary: Negative for difficulty urinating, dysuria, frequency and urgency.  Musculoskeletal: Negative for arthralgias, joint swelling and myalgias.       Pos for leg pain (both) this am that is gone now  Skin: Negative for pallor and rash.       Pos for redness/pain scrotum area from infection    Neurological: Negative for dizziness, tremors, weakness, numbness and headaches.  Hematological: Negative for adenopathy. Does not bruise/bleed easily.  Psychiatric/Behavioral: Negative for decreased concentration and dysphoric mood. The patient is not nervous/anxious.        Objective:   Physical Exam  Constitutional: He appears well-developed and well-nourished. No distress.  overwt and well app  HENT:  Head: Normocephalic and atraumatic.  Right Ear: External ear normal.  Left Ear: External ear normal.  Nose: Nose normal.  Mouth/Throat: Oropharynx is clear and moist.  Eyes: Pupils are equal, round, and reactive to light. Conjunctivae and EOM are normal. Right eye exhibits no discharge. Left eye exhibits no discharge. No scleral icterus.  Neck: Normal range of motion. Neck supple. No JVD present. Carotid bruit is not present. No thyromegaly present.  Cardiovascular: Normal rate, regular rhythm, normal heart sounds and intact distal pulses.  Exam reveals no gallop.   Pulmonary/Chest: Effort normal and breath sounds normal. No respiratory distress. He has no wheezes. He exhibits no tenderness.  Abdominal: Soft. Bowel sounds are normal. He exhibits no distension, no abdominal bruit and no mass. There is no tenderness. There is no rebound and no guarding.  Genitourinary:  Genitourinary Comments: Scrotum is erythematous and mildly swollen symmetrically  No rash or satellite lesions Mildly tender  No testicular masses No skin breakdown or drainage or lesions  Musculoskeletal: He exhibits no edema or tenderness.  No swelling or tenderness or palp cords  Neg Homans sign  Nl perf  Lymphadenopathy:    He has no cervical adenopathy.  Neurological: He is alert. He has normal reflexes. No cranial nerve deficit. He exhibits normal muscle tone. Coordination normal.  Skin: Skin is warm and dry. No rash noted. No erythema. No pallor.  Psychiatric: He has a normal mood and affect.           Assessment & Plan:   Problem List Items Addressed This Visit      Genitourinary   Cellulitis of scrotum    Reviewed recent UC note On clinda and bactroban -since sat pm  Per pt no worse  Will continue to follow Update if not starting to improve in a week or if worsening          Other   Elevated transaminase level    AST and ALT are up  Suspect due to etoh (has been in the past)  Not taking acetaminophen  Is on abx  Fatty liver is possible   Will re check 1 mo after holding etoh  Watch for abd pain or other symptoms  Hyperglycemia - Primary    Lab Results  Component Value Date   HGBA1C 5.3 08/21/2017   Good control with diet  disc imp of low glycemic diet and wt loss to prevent DM2       Hyperlipidemia    Disc goals for lipids and reasons to control them Rev labs with pt Rev low sat fat diet in detail LDL in 140s and pt's diet is not optimal  Good HDL-though down  Given info on diet  Urged to cut back on red meat and fried foods        Routine general medical examination at a health care facility    Reviewed health habits including diet and exercise and skin cancer prevention Reviewed appropriate screening tests for age  Also reviewed health mt list, fam hx and immunization status , as well as social and family history   See HPI Labs reviewed  bp up due to stress/pain today- will follow that  Counseled on etoh use in light of liver tests -will hold all etoh for now  Also counseled on smoking cessation-pt states he is not ready         Smoker    Disc in detail risks of smoking and possible outcomes including copd, vascular/ heart disease, cancer , respiratory and sinus infections  Pt voices understanding Pt states he is not ready to quit but thinking about it

## 2017-08-29 ENCOUNTER — Telehealth: Payer: Self-pay | Admitting: Family Medicine

## 2017-08-29 DIAGNOSIS — M79605 Pain in left leg: Principal | ICD-10-CM

## 2017-08-29 DIAGNOSIS — M79604 Pain in right leg: Secondary | ICD-10-CM | POA: Insufficient documentation

## 2017-08-29 NOTE — Telephone Encounter (Signed)
Pt notified of Dr. Marliss Coots comments and instructions. Pt would like to get the [place] in [place]. If possible please do it as an urgent order because he would need to have it done by Friday when he is suppose to go back to work.   Pt said that Dr. Ledell Peoples office called but told him that is culture was clear so this is the 1st he is hearing about Yeast. Please advise

## 2017-08-29 NOTE — Telephone Encounter (Signed)
When I saw him this was improved but it must have worsened again  Any swelling? Any redness or lumps in legs?   (he had just traveled and we had discussed ruling out a blood clot) Are both legs hurting the same or one more than the other ?  How about lower back or buttock pain?  Any visibly swollen joints or rash?   How is his cellulitis?

## 2017-08-29 NOTE — Telephone Encounter (Signed)
Copied from [place] #[id number]. Topic: Inquiry >> Aug 29, 2017  3:04 PM Boyd Kerbs wrote: Reason for CRM: pt. Wants to speak to dr. Glori Bickers. He has experienced sharp pain from his knee to his feet the last two days.

## 2017-08-29 NOTE — Telephone Encounter (Signed)
The culture noted "light" growth of yeast so perhaps they do not consider that enough to treat (or a contaminant) I recommend he call their office to clarify   I will order the vascular study and route to [facility]

## 2017-08-29 NOTE — Telephone Encounter (Signed)
Spoke with pt and he said that the pain starts in the middle of the night, the pain wakes him up, it starts around his knees and radiates down to his feet (both legs), pt said he hasn't had any swelling, or redness and no back or buttock pain. The only thing he can see is some spider veins but he isn't sure if that's new or if they have been there. Only sxs pt is complaining of is pain

## 2017-08-29 NOTE — Telephone Encounter (Signed)
Given that he has just traveled -we should go ahead and check out the veins in his legs with an ultrasound (I doubt there is a clot given that there is no swelling - but it could also tell us if he has any venous insufficiency)  If symptoms get severe-go to the ED  Elevate legs /cool or warm compresses if they help  Let me know if he prefers [place] or [place] for that   I looked up side effects of his antibiotic- joint pain is a very rare side effect but I will keep that in mind also   I just rec his culture report back from Dr Allyson Sabal (derm ) regarding his skin infection  It showed some yeast.  Has he heard back from there office regarding a treatment plan?

## 2017-08-30 NOTE — Telephone Encounter (Signed)
Appt made at [facility] office for 08/31/17 and patient is aware.

## 2017-08-31 ENCOUNTER — Ambulatory Visit (HOSPITAL_COMMUNITY): Admission: RE | Admit: 2017-08-31 | Payer: Managed Care, Other (non HMO) | Source: Ambulatory Visit

## 2017-09-04 ENCOUNTER — Telehealth: Payer: Self-pay | Admitting: *Deleted

## 2017-09-04 MED ORDER — KETOCONAZOLE 2 % EX CREA: 1.0000 "application " | TOPICAL_CREAM | Freq: Every day | CUTANEOUS | 1 refills | Status: DC

## 2017-09-04 NOTE — Telephone Encounter (Signed)
I would not continue clindamycin if the redness and swelling is gone  Let's try an antifungal cream for the discoloration (in case it is fungal) It may take a while to get totally back to normal but I'm glad it is improved   If agreeable please call in ketoconazole cream

## 2017-09-04 NOTE — Telephone Encounter (Signed)
Called pt and he wanted me to see if Dr. Glori Bickers would extend his abx and cream for the infection in his scrotum, pt was on a 10 day course of clindamycin and his last pill is today, he is afraid that the infection will come back if he stops the abx "to soon". Pt said that the swelling and pain have resolved but he just had some discoloration of his scrotum and that made him question if he should have his abx extended. Please advise

## 2017-09-04 NOTE — Telephone Encounter (Signed)
Copied from [place] #[id number]. Topic: Inquiry >> Sep 04, 2017  2:30 PM Conception Chancy, NT wrote: Reason for CRM: pt was seen last Monday. Would like to only discuss issues with Dr. Glori Bickers nurse.

## 2017-09-04 NOTE — Telephone Encounter (Signed)
Left voicemail letting pt know Dr. Marliss Coots instructions and that Rx was sent to pharmacy

## 2017-09-23 ENCOUNTER — Emergency Department
Admission: EM | Admit: 2017-09-23 | Discharge: 2017-09-24 | Disposition: A | Discharge status: Home / Self Care | Payer: Managed Care, Other (non HMO) | Attending: Emergency Medicine | Admitting: Emergency Medicine

## 2017-09-23 ENCOUNTER — Other Ambulatory Visit: Payer: Self-pay

## 2017-09-23 DIAGNOSIS — R040 Epistaxis: Secondary | ICD-10-CM | POA: Insufficient documentation

## 2017-09-23 DIAGNOSIS — Z79899 Other long term (current) drug therapy: Secondary | ICD-10-CM | POA: Insufficient documentation

## 2017-09-23 DIAGNOSIS — F17210 Nicotine dependence, cigarettes, uncomplicated: Secondary | ICD-10-CM | POA: Insufficient documentation

## 2017-09-23 MED ORDER — OXYMETAZOLINE HCL 0.05 % NA SOLN
1.0000 | Freq: Once | NASAL | Status: AC
  Administered 2017-09-23: 1 via NASAL
  Filled 2017-09-23: qty 15

## 2017-09-23 MED ORDER — BACITRACIN ZINC 500 UNIT/GM EX OINT
TOPICAL_OINTMENT | CUTANEOUS | Status: AC
  Filled 2017-09-23: qty 0.9

## 2017-09-23 NOTE — ED Triage Notes (Signed)
Patient c/o epistaxis beginning at 89. Patient. Patient reports 2 nosebleeds in the past, last one 2 years ago, however not as severe.  Patient reports he feels dizzy/weak.

## 2017-09-23 NOTE — ED Notes (Signed)
Pt states that he was just having a conversation with a friend around 7:30pm and his nose started bleeding.

## 2017-09-24 NOTE — Discharge Instructions (Signed)
Gently using Q-tip to put some Vaseline or antibiotic ointment up in the front part of the nose 2 or 3 times a day for the next couple days. This will help keep the area moist and let it heal. If syou get another nosebleed you can put the nose clamp we gave you on the soft part of the nose. Leave it there for 5-10 minutes. If that does not make a nosebleed stop return here. If the nosebleed stops after 5 or 10 minutes U can take the clamp off. If it starts again put the clamp back on and return.
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
## 2017-09-27 ENCOUNTER — Other Ambulatory Visit: Payer: Managed Care, Other (non HMO)

## 2017-10-04 ENCOUNTER — Telehealth: Payer: Self-pay | Admitting: Family Medicine

## 2017-10-04 DIAGNOSIS — R740 Nonspecific elevation of levels of transaminase and lactic acid dehydrogenase [LDH]: Principal | ICD-10-CM

## 2017-10-04 DIAGNOSIS — R7401 Elevation of levels of liver transaminase levels: Secondary | ICD-10-CM

## 2017-10-04 NOTE — Telephone Encounter (Signed)
-----   Message from Ellamae Sia sent at 10/04/2017 10:54 AM EST ----- Regarding: Lab orders for Monday, 12.3.18 Lab orders for a 1 month f/u

## 2017-10-09 ENCOUNTER — Other Ambulatory Visit: Payer: Managed Care, Other (non HMO)

## 2017-10-16 ENCOUNTER — Other Ambulatory Visit: Payer: Managed Care, Other (non HMO)

## 2017-11-10 ENCOUNTER — Other Ambulatory Visit: Payer: BLUE CROSS/BLUE SHIELD

## 2017-11-13 ENCOUNTER — Encounter: Payer: BLUE CROSS/BLUE SHIELD | Admitting: Family Medicine

## 2017-12-20 ENCOUNTER — Encounter: Payer: Self-pay | Admitting: Family Medicine

## 2017-12-20 ENCOUNTER — Ambulatory Visit: Payer: Managed Care, Other (non HMO) | Admitting: Family Medicine

## 2017-12-20 VITALS — BP 128/74 | HR 112 | Temp 98.3°F | Ht 68.75 in | Wt 202.2 lb

## 2017-12-20 DIAGNOSIS — B977 Papillomavirus as the cause of diseases classified elsewhere: Secondary | ICD-10-CM | POA: Insufficient documentation

## 2017-12-20 DIAGNOSIS — Z8619 Personal history of other infectious and parasitic diseases: Secondary | ICD-10-CM

## 2017-12-20 DIAGNOSIS — Z23 Encounter for immunization: Secondary | ICD-10-CM | POA: Diagnosis not present

## 2017-12-20 DIAGNOSIS — R740 Nonspecific elevation of levels of transaminase and lactic acid dehydrogenase [LDH]: Secondary | ICD-10-CM

## 2017-12-20 DIAGNOSIS — R7401 Elevation of levels of liver transaminase levels: Secondary | ICD-10-CM

## 2017-12-20 LAB — HEPATIC FUNCTION PANEL
ALT: 128 U/L — ABNORMAL HIGH (ref 0–53)
AST: 67 U/L — ABNORMAL HIGH (ref 0–37)
Albumin: 4.8 g/dL (ref 3.5–5.2)
Alkaline Phosphatase: 80 U/L (ref 39–117)
BILIRUBIN DIRECT: 0.1 mg/dL (ref 0.0–0.3)
Total Bilirubin: 0.5 mg/dL (ref 0.2–1.2)
Total Protein: 7.8 g/dL (ref 6.0–8.3)

## 2017-12-20 NOTE — Patient Instructions (Signed)
Labs today for liver function  Keep watching alcohol intake   We will refer you to dermatology   HPV shot today
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
## 2017-12-20 NOTE — Assessment & Plan Note (Signed)
Start HPV series today  He is over 24 but this was recommended by derm and I agree  Declines std screening  Ref to new dermatologist since his retired

## 2017-12-20 NOTE — Assessment & Plan Note (Signed)
Re check today  Pt decreased etoh intake from 4 to 2 alcoholic drinks per day  Avoiding acetaminophen  No symptoms Missed original appt for labs- will do while here for vaccine

## 2017-12-22 ENCOUNTER — Telehealth: Payer: Self-pay

## 2017-12-22 DIAGNOSIS — R740 Nonspecific elevation of levels of transaminase and lactic acid dehydrogenase [LDH]: Principal | ICD-10-CM

## 2017-12-22 DIAGNOSIS — R7401 Elevation of levels of liver transaminase levels: Secondary | ICD-10-CM

## 2017-12-22 NOTE — Telephone Encounter (Signed)
I put the orders in 

## 2017-12-22 NOTE — Telephone Encounter (Signed)
Tower, Wynelle Fanny, MD  [place], [place], CMA        Liver tests are still elevated- in the past these came down with cessation of etoh  Would he be able to completely quit etoh or cut back more?  Re check in 1 mo with some other labs for liver  If not improved I will order a liver US  Thanks    MT-I spoke to pt/he is going to cut back on ETOH/I have him scheduled for a lab visit on 3.15.19 @ 11am/plz advise what future orders you would like/thx dmf

## 2017-12-25 ENCOUNTER — Telehealth: Payer: Self-pay | Admitting: Family Medicine

## 2017-12-25 NOTE — Telephone Encounter (Signed)
I do not recommend any over the counter products- they have not been tested/ approved by the FDA

## 2017-12-25 NOTE — Telephone Encounter (Signed)
Pt wanted to f/u with Dr. Glori Bickers. He said besides cutting back on alcohol and tylenol is there any OTC supplements or Rxs you can recommend that would help his liver function? Pt said he was researching online and found something called Liver MD (natural supplement) he wanted to get your opinion on that med or are there any other meds he should try?

## 2017-12-25 NOTE — Telephone Encounter (Signed)
Copied from [place]. Topic: Quick Communication - See Telephone Encounter >> Dec 25, 2017 12:37 PM Hewitt Shorts wrote: CRM for notification. See Telephone encounter for: pt is needing a return call in reagrds to blood test on his liver   Best number [phone]  12/25/17.

## 2017-12-26 NOTE — Telephone Encounter (Signed)
Pt notified of Dr. Tower's comments and verbalized understanding  

## 2018-01-02 ENCOUNTER — Telehealth: Payer: Self-pay | Admitting: Family Medicine

## 2018-01-02 NOTE — Telephone Encounter (Signed)
Copied from [place]. Topic: Quick Communication - See Telephone Encounter >> Jan 02, 2018  2:42 PM Boyd Kerbs wrote: CRM for notification. See Telephone encounter for:   Pt. Lance Foley will not pay for HPV unless PA. Asking to have this  PA.   Please call pt. When hear from insurance on [place]  01/02/18.

## 2018-01-19 ENCOUNTER — Other Ambulatory Visit: Payer: Managed Care, Other (non HMO)

## 2018-01-22 ENCOUNTER — Other Ambulatory Visit: Payer: Managed Care, Other (non HMO)

## 2018-01-31 ENCOUNTER — Ambulatory Visit: Payer: Managed Care, Other (non HMO)

## 2018-02-05 ENCOUNTER — Other Ambulatory Visit (INDEPENDENT_AMBULATORY_CARE_PROVIDER_SITE_OTHER): Payer: Managed Care, Other (non HMO)

## 2018-02-05 DIAGNOSIS — R7401 Elevation of levels of liver transaminase levels: Secondary | ICD-10-CM

## 2018-02-05 DIAGNOSIS — R740 Nonspecific elevation of levels of transaminase and lactic acid dehydrogenase [LDH]: Secondary | ICD-10-CM | POA: Diagnosis not present

## 2018-02-05 LAB — HEPATIC FUNCTION PANEL
ALT: 54 U/L — ABNORMAL HIGH (ref 0–53)
AST: 34 U/L (ref 0–37)
Albumin: 4.2 g/dL (ref 3.5–5.2)
Alkaline Phosphatase: 60 U/L (ref 39–117)
BILIRUBIN DIRECT: 0.1 mg/dL (ref 0.0–0.3)
Total Bilirubin: 0.6 mg/dL (ref 0.2–1.2)
Total Protein: 7 g/dL (ref 6.0–8.3)

## 2018-02-05 LAB — BASIC METABOLIC PANEL
BUN: 11 mg/dL (ref 6–23)
CHLORIDE: 102 meq/L (ref 96–112)
CO2: 31 meq/L (ref 19–32)
Calcium: 9.4 mg/dL (ref 8.4–10.5)
Creatinine, Ser: 0.85 mg/dL (ref 0.40–1.50)
GFR: 109.79 mL/min (ref >60.00)
Glucose, Bld: 108 mg/dL — ABNORMAL HIGH (ref 70–99)
Potassium: 3.6 mEq/L (ref 3.5–5.1)
Sodium: 140 mEq/L (ref 135–145)

## 2018-02-05 LAB — PROTIME-INR
INR: 1 ratio (ref 0.8–1.0)
PROTHROMBIN TIME: 11 s (ref 9.6–13.1)

## 2018-02-05 LAB — FERRITIN: Ferritin: 196.1 ng/mL (ref 22.0–322.0)

## 2018-02-07 ENCOUNTER — Ambulatory Visit (INDEPENDENT_AMBULATORY_CARE_PROVIDER_SITE_OTHER): Payer: Managed Care, Other (non HMO)

## 2018-02-07 DIAGNOSIS — Z23 Encounter for immunization: Secondary | ICD-10-CM

## 2018-02-07 LAB — HEPATITIS C ANTIBODY
HEP C AB: NONREACTIVE
SIGNAL TO CUT-OFF: 0.01 (ref <1.00)

## 2018-02-07 LAB — CERULOPLASMIN: Ceruloplasmin: 30 mg/dL (ref 18–36)

## 2018-02-07 LAB — ANA: Anti Nuclear Antibody(ANA): NEGATIVE

## 2018-02-07 LAB — HEPATITIS B SURFACE ANTIGEN: Hepatitis B Surface Ag: NONREACTIVE

## 2018-02-07 LAB — HEPATITIS B SURFACE ANTIBODY, QUANTITATIVE: Hepatitis B-Post: 89 m[IU]/mL (ref >=10)

## 2018-02-07 NOTE — Telephone Encounter (Signed)
Spoke with pt's insurance and the pre-cert dpt. They advsie me that they can't do a pre-cert for this immunization. It's either covered by his insurance or not. Per the coverage dpt it's only covered until age 34 and after that they don't cover immunization.   Routing to [employer] lead to follow up with pt and per Dr. Glori Bickers since this was never routed to [place] to follow up with until after pt was here and received 2nd vaccine she is requesting that we waive fee today

## 2018-02-07 NOTE — Telephone Encounter (Signed)
Pt was in office for 2nd HPR vaccine and pt asked about PA for HPV vaccine. FYI to [place]. Pt request cb when have answer to PA.

## 2018-02-09 NOTE — Telephone Encounter (Signed)
Left detailed message (okay per DPR) that I was calling to follow up on the ins coverage questions regarding his med/vaccination series that he has been receiving.  I requested he call back as soon as he is able to discuss the findings and to develop a plan for the 3rd vaccine.

## 2018-02-13 ENCOUNTER — Telehealth: Payer: Self-pay

## 2018-02-13 NOTE — Telephone Encounter (Signed)
See other phone note documentation. Patient has been contacted.

## 2018-02-13 NOTE — Telephone Encounter (Signed)
Patient made aware that his insurance will not accept a prior authorization or provide ANY coverage for the HPV vaccination series due to his age.  Dr. Glori Bickers states that given patient's medical needs it is deemed medically necessary and patient should complete the series.  We will not charge patient for the first 2 HPV injections that he has already received due to the fact that we should have communicated this with him up front with understanding of costs that he would incur.  Patient should have then signed a waiver if he was in agreement moving forward with the charges.  As this communication did not occur, our practice will write off the initial injections.   Patient then informed that the 3rd shot would be at his out of pocket expense should he chose to finish out the series.  Dr. Glori Bickers strongly encourages him to do so but will give him the choice based on his knowledge of the cost.  Patient verbalizes understanding of all above and agrees to move forward with the 3rd and final shot at his expense.  Appt has been set up for August.    He is thankful for our efforts to work with him on this and resolve the breakdown.  Patient was informed that should he receive a bill for the 2nd dose, he should notify our office to handle.

## 2018-02-13 NOTE — Telephone Encounter (Signed)
Copied from [place]. Topic: Inquiry >> Feb 13, 2018  2:48 PM Oliver Pila B wrote: Reason for CRM: pt called to speak w/ mandy call pt back when possible

## 2018-02-14 NOTE — Telephone Encounter (Signed)
Thank you :)

## 2018-06-20 ENCOUNTER — Ambulatory Visit (INDEPENDENT_AMBULATORY_CARE_PROVIDER_SITE_OTHER): Payer: Managed Care, Other (non HMO) | Admitting: *Deleted

## 2018-06-20 DIAGNOSIS — Z23 Encounter for immunization: Secondary | ICD-10-CM | POA: Diagnosis not present

## 2018-08-20 ENCOUNTER — Other Ambulatory Visit: Payer: Managed Care, Other (non HMO)

## 2018-08-29 ENCOUNTER — Encounter: Payer: Managed Care, Other (non HMO) | Admitting: Family Medicine

## 2018-09-09 ENCOUNTER — Telehealth: Payer: Self-pay | Admitting: Family Medicine

## 2018-09-09 DIAGNOSIS — E7849 Other hyperlipidemia: Secondary | ICD-10-CM

## 2018-09-09 DIAGNOSIS — R739 Hyperglycemia, unspecified: Secondary | ICD-10-CM

## 2018-09-09 DIAGNOSIS — Z0000 Encounter for general adult medical examination without abnormal findings: Secondary | ICD-10-CM

## 2018-09-09 NOTE — Telephone Encounter (Signed)
-----   Message from Ellamae Sia sent at 09/03/2018  2:42 PM EDT ----- Regarding: Lab orders for Monday, 11.4.19 Patient is scheduled for CPX labs, please order future labs, Thanks , Karna Christmas

## 2018-09-10 ENCOUNTER — Other Ambulatory Visit (INDEPENDENT_AMBULATORY_CARE_PROVIDER_SITE_OTHER): Payer: Managed Care, Other (non HMO)

## 2018-09-10 DIAGNOSIS — Z0000 Encounter for general adult medical examination without abnormal findings: Secondary | ICD-10-CM | POA: Diagnosis not present

## 2018-09-10 DIAGNOSIS — R739 Hyperglycemia, unspecified: Secondary | ICD-10-CM | POA: Diagnosis not present

## 2018-09-10 DIAGNOSIS — E7849 Other hyperlipidemia: Secondary | ICD-10-CM | POA: Diagnosis not present

## 2018-09-10 LAB — LIPID PANEL
CHOL/HDL RATIO: 3
Cholesterol: 279 mg/dL — ABNORMAL HIGH (ref 0–200)
HDL: 81.3 mg/dL (ref >39.00)
LDL CALC: 175 mg/dL — AB (ref 0–99)
NONHDL: 198.15
Triglycerides: 114 mg/dL (ref 0.0–149.0)
VLDL: 22.8 mg/dL (ref 0.0–40.0)

## 2018-09-10 LAB — COMPREHENSIVE METABOLIC PANEL
ALT: 102 U/L — AB (ref 0–53)
AST: 71 U/L — ABNORMAL HIGH (ref 0–37)
Albumin: 4.7 g/dL (ref 3.5–5.2)
Alkaline Phosphatase: 75 U/L (ref 39–117)
BILIRUBIN TOTAL: 0.5 mg/dL (ref 0.2–1.2)
BUN: 12 mg/dL (ref 6–23)
CALCIUM: 9.7 mg/dL (ref 8.4–10.5)
CHLORIDE: 99 meq/L (ref 96–112)
CO2: 28 meq/L (ref 19–32)
Creatinine, Ser: 0.89 mg/dL (ref 0.40–1.50)
GFR: 103.74 mL/min (ref >60.00)
GLUCOSE: 98 mg/dL (ref 70–99)
POTASSIUM: 4.1 meq/L (ref 3.5–5.1)
Sodium: 136 mEq/L (ref 135–145)
Total Protein: 7.4 g/dL (ref 6.0–8.3)

## 2018-09-10 LAB — CBC WITH DIFFERENTIAL/PLATELET
BASOS PCT: 0.6 % (ref 0.0–3.0)
Basophils Absolute: 0.1 10*3/uL (ref 0.0–0.1)
EOS PCT: 1.5 % (ref 0.0–5.0)
Eosinophils Absolute: 0.2 10*3/uL (ref 0.0–0.7)
HEMATOCRIT: 49.7 % (ref 39.0–52.0)
Hemoglobin: 16.9 g/dL (ref 13.0–17.0)
LYMPHS ABS: 2.4 10*3/uL (ref 0.7–4.0)
LYMPHS PCT: 16 % (ref 12.0–46.0)
MCHC: 34 g/dL (ref 30.0–36.0)
MCV: 102.2 fl — AB (ref 78.0–100.0)
MONOS PCT: 7.7 % (ref 3.0–12.0)
Monocytes Absolute: 1.2 10*3/uL — ABNORMAL HIGH (ref 0.1–1.0)
NEUTROS ABS: 11.4 10*3/uL — AB (ref 1.4–7.7)
NEUTROS PCT: 74.2 % (ref 43.0–77.0)
PLATELETS: 293 10*3/uL (ref 150.0–400.0)
RBC: 4.87 Mil/uL (ref 4.22–5.81)
RDW: 13.9 % (ref 11.5–15.5)
WBC: 15.3 10*3/uL — ABNORMAL HIGH (ref 4.0–10.5)

## 2018-09-10 LAB — TSH: TSH: 1.5 u[IU]/mL (ref 0.35–4.50)

## 2018-09-10 LAB — HEMOGLOBIN A1C: HEMOGLOBIN A1C: 5.4 % (ref 4.6–6.5)

## 2018-09-17 ENCOUNTER — Encounter: Payer: Self-pay | Admitting: *Deleted

## 2018-09-17 ENCOUNTER — Encounter: Payer: Self-pay | Admitting: Family Medicine

## 2018-09-17 ENCOUNTER — Ambulatory Visit (INDEPENDENT_AMBULATORY_CARE_PROVIDER_SITE_OTHER): Payer: Managed Care, Other (non HMO) | Admitting: Family Medicine

## 2018-09-17 VITALS — BP 142/100 | HR 101 | Temp 98.3°F | Ht 69.0 in | Wt 188.2 lb

## 2018-09-17 DIAGNOSIS — R740 Nonspecific elevation of levels of transaminase and lactic acid dehydrogenase [LDH]: Secondary | ICD-10-CM

## 2018-09-17 DIAGNOSIS — Z0000 Encounter for general adult medical examination without abnormal findings: Secondary | ICD-10-CM | POA: Diagnosis not present

## 2018-09-17 DIAGNOSIS — D72825 Bandemia: Secondary | ICD-10-CM

## 2018-09-17 DIAGNOSIS — R7303 Prediabetes: Secondary | ICD-10-CM | POA: Diagnosis not present

## 2018-09-17 DIAGNOSIS — H8109 Meniere's disease, unspecified ear: Secondary | ICD-10-CM

## 2018-09-17 DIAGNOSIS — E7800 Pure hypercholesterolemia, unspecified: Secondary | ICD-10-CM

## 2018-09-17 DIAGNOSIS — R7401 Elevation of levels of liver transaminase levels: Secondary | ICD-10-CM

## 2018-09-17 DIAGNOSIS — F17200 Nicotine dependence, unspecified, uncomplicated: Secondary | ICD-10-CM

## 2018-09-17 DIAGNOSIS — F1010 Alcohol abuse, uncomplicated: Secondary | ICD-10-CM

## 2018-09-17 MED ORDER — NICOTINE 21 MG/24HR TD PT24: 21.0000 mg | MEDICATED_PATCH | Freq: Every day | TRANSDERMAL | 1 refills | Status: DC

## 2018-09-17 NOTE — Patient Instructions (Addendum)
Start weighing yourself at home   Think about starting some exercise -work up to 30 minutes 5 or more days per week   Your cholesterol is in the range for medicine- but I am hesitant until liver tests improve   White blood cell count is up - keep trying to quit smoking -I will send in patches   Liver tests are up  Blood pressure is up to  Anything you can do to cut back drinking - please consider it  Start a multi vitamin every day  Also try magnesium 250 mg once daily - to help cramps    I will look into options for treatment and we will contact you

## 2018-09-17 NOTE — Assessment & Plan Note (Signed)
Lab Results  Component Value Date   ALT 102 (H) 09/10/2018   AST 71 (H) 09/10/2018   ALKPHOS 75 09/10/2018   BILITOT 0.5 09/10/2018   Up from prev From etoh use/abuse  No symptoms  Looking at options for rehab/recovery  Urged him to at least start cutting back etoh intake

## 2018-09-17 NOTE — Assessment & Plan Note (Signed)
LDL is up to 175  Disc goals for lipids and reasons to control them Rev last labs with pt Rev low sat fat diet in detail Not motivated for change yet Apprehensive to start statin in light of elevated transaminases from etoh abuse

## 2018-09-17 NOTE — Assessment & Plan Note (Signed)
Continues diazide No clinical changes

## 2018-09-17 NOTE — Assessment & Plan Note (Signed)
Ongoing - finally admitting to  Causing worse dec motivation  Interested in exploring options for rehab or recovery  Not particularly interested in 12 step program  Drinks 2-3 fifths of etoh per week  No hx of w/d or cessation  One DWI years ago- had 8 AA meetings (did not want to quit at that time)

## 2018-09-17 NOTE — Assessment & Plan Note (Signed)
Has seen hematology  Still smoking  Urged to quit and nicotine patch px  Also disc etoh cessation  No s/s of infection  Continue to follow

## 2018-09-17 NOTE — Assessment & Plan Note (Signed)
Disc in detail risks of smoking and possible outcomes including copd, vascular/ heart disease, cancer , respiratory and sinus infections  Pt voices understanding Nicotine patches px
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
## 2018-09-17 NOTE — Assessment & Plan Note (Addendum)
Reviewed health habits including diet and exercise and skin cancer prevention Reviewed appropriate screening tests for age  Also reviewed health mt list, fam hx and immunization status , as well as social and family history   Labs rev  Primary issue is etoh abuse-looking into tx options  Also interested in smoking cessation (px patches)  Will plan f/u after plan for etoh treatment  Enc healthy diet/exercise (not motivated)  Declines flu shot (would benefit from this and pna vaccine in light of smoking)

## 2018-09-17 NOTE — Assessment & Plan Note (Signed)
Reassuring labs Lab Results  Component Value Date   HGBA1C 5.4 09/10/2018   Disc low glycemic diet  Making plan to cut etoh

## 2018-09-18 ENCOUNTER — Encounter: Payer: Self-pay | Admitting: Family Medicine

## 2019-01-16 ENCOUNTER — Ambulatory Visit: Payer: Managed Care, Other (non HMO) | Admitting: Podiatrist

## 2019-01-16 ENCOUNTER — Other Ambulatory Visit: Payer: Self-pay

## 2019-01-16 ENCOUNTER — Ambulatory Visit (INDEPENDENT_AMBULATORY_CARE_PROVIDER_SITE_OTHER): Payer: 59

## 2019-01-16 ENCOUNTER — Other Ambulatory Visit: Payer: Self-pay | Admitting: Podiatrist

## 2019-01-16 VITALS — BP 143/103 | HR 73

## 2019-01-16 DIAGNOSIS — M216X1 Other acquired deformities of right foot: Secondary | ICD-10-CM | POA: Diagnosis not present

## 2019-01-16 DIAGNOSIS — M79671 Pain in right foot: Secondary | ICD-10-CM

## 2019-01-16 DIAGNOSIS — M7751 Other enthesopathy of right foot: Secondary | ICD-10-CM

## 2019-01-16 DIAGNOSIS — Q828 Other specified congenital malformations of skin: Secondary | ICD-10-CM

## 2019-01-16 DIAGNOSIS — M2041 Other hammer toe(s) (acquired), right foot: Secondary | ICD-10-CM | POA: Diagnosis not present

## 2019-01-16 NOTE — Patient Instructions (Signed)
We will check insurance coverage for orthotics-  Will call you with the information and with how to proceed if you are interested.

## 2019-01-18 ENCOUNTER — Encounter: Payer: Self-pay | Admitting: Podiatrist

## 2019-01-18 NOTE — Progress Notes (Signed)
  Chief Complaint  Patient presents with  . Foot Pain    Pt states Right sub 5th mt pain, right sub 1st mt pain, some posterior heel pain, 61mo duration, no known injuries.     HPI: Patient is 33 y.o. male who presents today for pain submetatarsal 5 of the right foot and pain submetatarsal  first of the right foot as well.  Mild posterior heel pain present.  No trauma or injury noted.   Review of Systems  DATA OBTAINED: from patient  GENERAL: Feels well no fevers, no fatigue, no changes in appetite SKIN: No itching, no rashes, no open wounds EYES: No eye pain,no redness, no discharge EARS: No earache,no ringing of ears, NOSE: No congestion, no drainage, no bleeding  MOUTH/THROAT: No mouth pain, No sore throat, No difficulty chewing or swallowing  RESPIRATORY: No cough, no wheezing, no SOB CARDIAC: No chest pain,no heart palpitations, GI: No abdominal pain, No Nausea, no vomiting, no diarrhea, no heartburn or no reflux  GU: No dysuria, no increased frequency or urgency MUSCULOSKELETAL: No unrelieved bone/joint pain,  NEUROLOGIC: Awake, alert, appropriate to situation, No change in mental status. PSYCHIATRIC: No overt anxiety or sadness.No behavior issue.      Physical Exam  GENERAL APPEARANCE: Alert, conversant. Appropriately groomed. No acute distress.   VASCULAR: Pedal pulses palpable DP and PT bilateral.  Capillary refill time is immediate to all digits,  Proximal to distal cooling it warm to warm.  Digital hair growth is present bilateral   NEUROLOGIC: sensation is intact epicritically and protectively to 5.07 monofilament at 5/5 sites bilateral.  Light touch is intact bilateral, vibratory sensation intact bilateral, achilles tendon reflex is intact bilateral.   MUSCULOSKELETAL: acceptable muscle strength, tone and stability bilateral.  Intrinsic muscluature intact bilateral.  Cavus foot type bilateral is noted with hammertoe deformity with tight extensor pull on fourth and  fifth digit of the right foot.  Prominent and plantarflexed first and fifth metatarsals are noted right greater than left foot.  Lateral deviation of the right fifth digit is also present.  DERMATOLOGIC: skin is warm, supple, and dry.  No open lesions noted.  No interdigital maceration noted bilateral.   X-ray evaluation of the right foot reveals hammertoe deformity with contraction of lesser digits is noted on x-ray.  Cavus foot type is seen.  Normal bony mineralization is noted.  No acute osseous abnormalities are seen.   Assessment   Cavus foot type with hammertoe deformity  Plan  Discuss treatment options and alternatives.  Discussed orthotics, and discussed surgical correction of the hammertoes.  The patient will consider both and will call for follow-up with Dr. Amalia Hailey if he would like to consider surgical correction.  He will call if any concerns arise.

## 2019-01-28 ENCOUNTER — Ambulatory Visit: Payer: Managed Care, Other (non HMO) | Admitting: Podiatry

## 2019-01-28 ENCOUNTER — Telehealth: Payer: Self-pay | Admitting: Podiatry

## 2019-01-28 ENCOUNTER — Encounter: Payer: Self-pay | Admitting: Podiatry

## 2019-01-28 ENCOUNTER — Other Ambulatory Visit: Payer: Self-pay

## 2019-01-28 ENCOUNTER — Ambulatory Visit (INDEPENDENT_AMBULATORY_CARE_PROVIDER_SITE_OTHER): Payer: Managed Care, Other (non HMO)

## 2019-01-28 DIAGNOSIS — M2041 Other hammer toe(s) (acquired), right foot: Secondary | ICD-10-CM | POA: Diagnosis not present

## 2019-01-28 DIAGNOSIS — M2042 Other hammer toe(s) (acquired), left foot: Secondary | ICD-10-CM

## 2019-01-28 NOTE — Telephone Encounter (Signed)
I called pt, he states he had talked about hammer toe surgery on 4th and 5th toes that curl under but the 2nd and 3rd toes curl under also, and he wondered if there was anything that could be done for those toes.

## 2019-01-28 NOTE — Patient Instructions (Signed)
Pre-Operative Instructions  Congratulations, you have decided to take an important step towards improving your quality of life.  You can be assured that the doctors and staff at [facility] will be with you every step of the way.  Here are some important things you should know:  1. Plan to be at the surgery center/hospital at least 1 (one) hour prior to your scheduled time, unless otherwise directed by the surgical center/hospital staff.  You must have a responsible adult accompany you, remain during the surgery and drive you home.  Make sure you have directions to the surgical center/hospital to ensure you arrive on time. 2. If you are having surgery at [facility] or [facility] hospitals, you will need a copy of your medical history and physical form from your family physician within one month prior to the date of surgery. We will give you a form for your primary physician to complete.  3. We make every effort to accommodate the date you request for surgery.  However, there are times where surgery dates or times have to be moved.  We will contact you as soon as possible if a change in schedule is required.   4. No aspirin/ibuprofen for one week before surgery.  If you are on aspirin, any non-steroidal anti-inflammatory medications (Mobic, Aleve, Ibuprofen) should not be taken seven (7) days prior to your surgery.  You make take Tylenol for pain prior to surgery.  5. Medications - If you are taking daily heart and blood pressure medications, seizure, reflux, allergy, asthma, anxiety, pain or diabetes medications, make sure you notify the surgery center/hospital before the day of surgery so they can tell you which medications you should take or avoid the day of surgery. 6. No food or drink after midnight the night before surgery unless directed otherwise by surgical center/hospital staff. 7. No alcoholic beverages 24-hours prior to surgery.  No smoking 24-hours prior or 24-hours after  surgery. 8. Wear loose pants or shorts. They should be loose enough to fit over bandages, boots, and casts. 9. Don't wear slip-on shoes. Sneakers are preferred. 10. Bring your boot with you to the surgery center/hospital.  Also bring crutches or a walker if your physician has prescribed it for you.  If you do not have this equipment, it will be provided for you after surgery. 11. If you have not been contacted by the surgery center/hospital by the day before your surgery, call to confirm the date and time of your surgery. 12. Leave-time from work may vary depending on the type of surgery you have.  Appropriate arrangements should be made prior to surgery with your employer. 13. Prescriptions will be provided immediately following surgery by your doctor.  Fill these as soon as possible after surgery and take the medication as directed. Pain medications will not be refilled on weekends and must be approved by the doctor. 14. Remove nail polish on the operative foot and avoid getting pedicures prior to surgery. 15. Wash the night before surgery.  The night before surgery wash the foot and leg well with water and the antibacterial soap provided. Be sure to pay special attention to beneath the toenails and in between the toes.  Wash for at least three (3) minutes. Rinse thoroughly with water and dry well with a towel.  Perform this wash unless told not to do so by your physician.  Enclosed: 1 Ice pack (please put in freezer the night before surgery)   1 Hibiclens skin cleaner     Pre-op instructions  If you have any questions regarding the instructions, please do not hesitate to call our office.  [place]: [address], [place], [place] [address] -- [phone]  [place]: [address] , [place] [address] -- [phone]  [place]: [address]  [place], [place] [address] -- [phone]  [place]: [address], [address], [place], [place] [address] -- [phone]  Website:  [email or web address] 

## 2019-01-28 NOTE — Telephone Encounter (Signed)
I have a few questions for DrAmalia Hailey , I forgot to ask before surgery. Please call me on [phone].

## 2019-01-29 NOTE — Telephone Encounter (Signed)
Dr. Amalia Hailey routed message stating the 2,3rd toes were not painful and could be managed with deep and wide shoe box, pain is the indicator for surgery. I informed pt and he states understanding.

## 2019-01-29 NOTE — Progress Notes (Signed)
   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 He was recently seen by Dr. Valentina Lucks and discussed custom orthotics and possible surgical intervention. He is now interested in surgery. He has been taking OTC pain relievers. Walking with shoes on increases the pain. Patient is here for further evaluation and treatment.   Past Medical History:  Diagnosis Date  . Meniere syndrome       Objective: Physical Exam General: The patient is alert and oriented x3 in no acute distress.  Dermatology: Skin is cool, dry and supple bilateral lower extremities. Negative for open lesions or macerations.  Vascular: Palpable pedal pulses bilaterally. No edema or erythema noted. Capillary refill within normal limits.  Neurological: Epicritic and protective threshold grossly intact bilaterally.   Musculoskeletal Exam: All pedal and ankle joints range of motion within normal limits bilateral. Muscle strength 5/5 in all groups bilateral. Hammertoe contracture deformity noted to digits 4 and 5 of the bilateral feet.  Radiographic Exam: Hammertoe contracture deformity noted to the interphalangeal joints and MPJ of the respective hammertoe digits mentioned on clinical musculoskeletal exam.     Assessment: 1. 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 Patient evaluated. X-Rays reviewed.  2. Today we discussed the conservative versus surgical management of the presenting pathology. The patient opts for surgical management. All possible complications and details of the procedure were explained. All patient questions were answered. No guarantees were expressed or implied. 3. Authorization for surgery was initiated today. Surgery will consist of PIPJ arthroplasty with MPJ capsulotomy digits 4, 5 bilateral.  4. Return to clinic one week post op.     Edrick Kins, DPM [facility]  Dr. Edrick Kins, DPM    [address], [place] [address]                Office [phone]  Fax [phone]

## 2019-02-04 ENCOUNTER — Ambulatory Visit: Payer: Self-pay | Admitting: Psychiatry

## 2019-03-04 ENCOUNTER — Ambulatory Visit: Payer: Self-pay | Admitting: Psychiatry

## 2019-03-11 ENCOUNTER — Encounter: Payer: Self-pay | Admitting: Podiatry

## 2019-03-11 ENCOUNTER — Other Ambulatory Visit: Payer: Self-pay

## 2019-03-11 ENCOUNTER — Ambulatory Visit (INDEPENDENT_AMBULATORY_CARE_PROVIDER_SITE_OTHER): Payer: Managed Care, Other (non HMO) | Admitting: Podiatry

## 2019-03-11 VITALS — Temp 98.4°F

## 2019-03-11 DIAGNOSIS — M2042 Other hammer toe(s) (acquired), left foot: Secondary | ICD-10-CM | POA: Diagnosis not present

## 2019-03-11 DIAGNOSIS — M2041 Other hammer toe(s) (acquired), right foot: Secondary | ICD-10-CM | POA: Diagnosis not present

## 2019-03-11 NOTE — Progress Notes (Signed)
   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  Last visit surgical consent was obtained and the patient is scheduled on 04/18/2019 for correction of hammertoes 4, 5 bilateral.  He has some questions today and would like to discuss details and questions regarding the procedures.  Past Medical History:  Diagnosis Date  . Meniere syndrome       Objective: Physical Exam General: The patient is alert and oriented x3 in no acute distress.  Dermatology: Skin is cool, dry and supple bilateral lower extremities. Negative for open lesions or macerations.  Vascular: Palpable pedal pulses bilaterally. No edema or erythema noted. Capillary refill within normal limits.  Neurological: Epicritic and protective threshold grossly intact bilaterally.   Musculoskeletal Exam: All pedal and ankle joints range of motion within normal limits bilateral. Muscle strength 5/5 in all groups bilateral. Hammertoe contracture deformity noted to digits 4 and 5 of the bilateral feet.  Radiographic Exam: Hammertoe contracture deformity noted to the interphalangeal joints and MPJ of the respective hammertoe digits mentioned on clinical musculoskeletal exam.     Assessment: 1. 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 Patient evaluated. X-Rays reviewed again today.  2.  Today we discussed surgery again in all details were explained.  Any questions were answered.  No guarantees were expressed or implied.   3. Authorization for surgery remains the same after today's visit.. Surgery will consist of PIPJ arthroplasty with MPJ capsulotomy digits 4, 5 bilateral.  4. Return to clinic one week post op.     Edrick Kins, DPM [facility]  Dr. Edrick Kins, DPM    [address], [place] [address]                Office [phone]   Fax [phone]

## 2019-03-13 ENCOUNTER — Telehealth: Payer: Self-pay | Admitting: Podiatry

## 2019-03-13 NOTE — Telephone Encounter (Signed)
Pt previously had an injection, April the 8th. He wanted to make sure it will be ok for Dr. Amalia Hailey to still do surgery on June the 11th.

## 2019-03-13 NOTE — Telephone Encounter (Signed)
Yes, okay for surgery.

## 2019-03-14 NOTE — Telephone Encounter (Signed)
Left message informing pt of Dr. Amalia Hailey statement.

## 2019-03-19 ENCOUNTER — Telehealth: Payer: Self-pay | Admitting: *Deleted

## 2019-03-19 NOTE — Telephone Encounter (Signed)
"  Lance Foley is saying he's supposed to be scheduled for surgery on June 11.  Isn't Dr. Amalia Hailey out on that date."  Yes, he's not doing surgery on April 18, 2019.  "He is saying you all gave him that date.  He's trying to arrange things at work."  I'll give him a call.  Looking at his notes in his medical records, it looks like he spoke to [place].  She may have told him that date was fine.   I am calling you in regards to scheduling your surgery.  I got a call from [place] at the surgical center.  She informed me that you said your surgery is scheduled for April 18, 2019.  Dr. Amalia Hailey is not doing surgeries on April 18, 2019.  His next available date is May 02, 2019.  "Okay, it's not a problem.  How about I call you back once I speak to my employer to make sure they have plenty of time to make arrangements for my absence.  I go back to work on Thursday.  I'll call you then."  Call me back and let me know.  (Reminder - Dr. Amalia Hailey)

## 2019-03-19 NOTE — Telephone Encounter (Signed)
"  I think I'll go ahead and schedule it for that date.  If there's a problem, it's okay for me to call you back, right?"  Yes, call me if you need to reschedule the date.  I'll get you scheduled for 05/02/2019.  "All I have to do is go online and register, right?"  Yes, go online and register with the surgical center through their portal.  "Someone will call me a day or two before my surgery date and let me know about the time correct?"  Yes, someone from the surgical center will call you.

## 2019-03-25 ENCOUNTER — Ambulatory Visit: Payer: Self-pay | Admitting: Psychiatry

## 2019-04-15 ENCOUNTER — Other Ambulatory Visit: Payer: Self-pay

## 2019-04-15 ENCOUNTER — Ambulatory Visit (INDEPENDENT_AMBULATORY_CARE_PROVIDER_SITE_OTHER): Payer: Managed Care, Other (non HMO) | Admitting: Psychiatry

## 2019-04-15 DIAGNOSIS — F422 Mixed obsessional thoughts and acts: Secondary | ICD-10-CM

## 2019-04-15 DIAGNOSIS — F411 Generalized anxiety disorder: Secondary | ICD-10-CM | POA: Diagnosis not present

## 2019-04-15 DIAGNOSIS — Z8619 Personal history of other infectious and parasitic diseases: Secondary | ICD-10-CM

## 2019-04-15 DIAGNOSIS — F1010 Alcohol abuse, uncomplicated: Secondary | ICD-10-CM | POA: Diagnosis not present

## 2019-04-15 DIAGNOSIS — F331 Major depressive disorder, recurrent, moderate: Secondary | ICD-10-CM

## 2019-04-15 DIAGNOSIS — R69 Illness, unspecified: Secondary | ICD-10-CM
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
## 2019-04-22 ENCOUNTER — Ambulatory Visit: Payer: Managed Care, Other (non HMO) | Admitting: Psychiatry

## 2019-04-23 ENCOUNTER — Other Ambulatory Visit: Payer: Self-pay

## 2019-04-23 ENCOUNTER — Ambulatory Visit (INDEPENDENT_AMBULATORY_CARE_PROVIDER_SITE_OTHER): Payer: 59 | Admitting: Psychiatry

## 2019-04-23 DIAGNOSIS — F331 Major depressive disorder, recurrent, moderate: Secondary | ICD-10-CM | POA: Diagnosis not present

## 2019-04-23 DIAGNOSIS — F411 Generalized anxiety disorder: Secondary | ICD-10-CM

## 2019-04-23 DIAGNOSIS — A630 Anogenital (venereal) warts: Secondary | ICD-10-CM

## 2019-04-23 DIAGNOSIS — F1010 Alcohol abuse, uncomplicated: Secondary | ICD-10-CM

## 2019-04-23 DIAGNOSIS — F422 Mixed obsessional thoughts and acts: Secondary | ICD-10-CM | POA: Diagnosis not present

## 2019-04-23 NOTE — Progress Notes (Signed)
Psychotherapy Progress Note [facility] Luan Moore, PhD LP  Patient ID: Lance Foley     MRN: [id number]     Therapy format: Individual psychotherapy Date: 04/23/2019     Start: 8:16a Stop: 9:15a Time Spent: 59 min (77, 14 donated)  Session narrative (presenting needs, interim history, self-report of stressors and symptoms, applications of prior therapy, status changes, and interventions made in session) Notes emerging fear of heights last couple years.  Last week felt anxiety surge on escalator shopping at [place], had to lean forward to cope.  Frustrating, depressing to see this.  Combined with assorted other fears/anxieties noted in evaluation, points to a more generalized fear of anxiety itself, as well as the  weariness of dealing with it, as central problems.  Oriented to CBT panic control therapy and the central idea of exposure therapy once coping skills are improved.  Re. c/o depression, oriented to fight-flight-freeze as protective instinct, not a character flaw, and reframed perceived demotivation ("I don't care") as an expression of the self-protection instinct when life itself seems like an overwhelming predator ("I need to not care").   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  Understanding is that his immune system is generally failing to protect him  Reveals mother has a longstanding negativistic, worried outlook, depressed at least since he was 33yo.  Has seen her try many antidepressants, lots of side effects, polypharmacy.  Bad back problems, 25 yrs on disability and pain medication.  Raised by grandparents.  Acknowledged some anger about the past, not yet ready to go into the story.  Assured he has choice over how much to get in to the story, only care for [place] purposes that he is free to express it and has choice over when to  dwell on it, since un-free and un-expressed tend to compromise mood, motivation, and immune function.    Surgery coming next Thursday -- hammer toe repair, both feet.  Possible anxiety about that, will be out of work 6-8 wks.  Does not feel bad about being off work, will be relief from anxiety/stress.  Has a good friend who can stay with him Morey Hummingbird, originally a romantic relationship, she wanted more than he, have settled on friendship).  Some concern for himself that he would relapse in drinking, being home all the time.  Probed whether he would want Morey Hummingbird authorized to confront if she saw warning signs, declined.  Framed as his prerogative, just want him to have backup if he wants it, and enough meaningful ways to spend time that he would not be subject to rumination and despair.  Discussed main recommendations -- panic control therapy chapter, referral to psychiatry for antidepressant/antianxiety, and 1-2 week followup, but PT would like to hold off to mid-July and let surgical recovery take further.  Reminded him that telehealth is available if mobility is an issue, but not particularly comfortable with that, will try to come in person regardless, but wants 2-3 weeks of postop under his belt first.  Will take psychiatry referral for medication.  Briefed on SSRI, SDRI, and SNRI therapies (noted hx allergy to Wellbutrin) and mentioned NAC as a natural help for obsessive anxiety and possibly other neurotic sxs.  Provided panic control therapy chapter from workbook as a primer on anxiety control skills and foundation for self-help while out of touch.  Agrees to call earlier if despair, alcohol,  or SI issues surge.  In session, noted forward posture throughout and engaged in body awareness and sitting back for a minute to feel the difference, allowing the furniture to "do the work", targeted trapezius relaxation, and orientation to vagal signalling for sense of wellbeing vs. tension.  Says he really only  relaxes at bedtime, oriented to the value of body-oriented breaks in the day and encouraged to take them.  Therapeutic modalities: Cognitive Behavioral Therapy, Solution-Oriented/Positive Psychology and Psycho-education/Bibliotherapy  Mental Status/Observations:  Appearance:   Casual     Behavior:  Appropriate and mildly reluctant  Motor:  Normal  Speech/Language:   Clear and Coherent  Affect:  Appropriate and Depressed  Mood:  depressed  Thought process:  normal  Thought content:    WNL  Sensory/Perceptual disturbances:    WNL  Orientation:  intact  Attention:  Good  Concentration:  Good  Memory:  WNL  Insight:    Fair  Judgment:   Fair  Impulse Control:  Fair   Risk Assessment: Danger to Self:  No Self-injurious Behavior: No Danger to Others: No Duty to Warn:no Physical Aggression / Violence:No  Access to Firearms a concern: No   Diagnosis:   ICD-10-CM   1. Major depressive disorder, recurrent episode, moderate (HCC)  F33.1   2. Generalized anxiety disorder  F41.1    with assorted phobias and fear of panic  3. Mixed obsessional thoughts and acts  F42.2   4. Alcohol abuse  F10.10   5. 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 Practice stress breathers during the day, preferably several . Refer for med eval . Reduce alcohol . May take up panic control therapy when feels ready . Other recommendations/advice as noted above . Continue to utilize previously learned skills ad lib . Maintain medication as prescribed and work faithfully with relevant prescriber(s) if any changes are desired or seem indicated . Call the clinic on-call service, present to ER, or call 911 if any life-threatening psychiatric crisis Return in 2 weeks (on 05/07/2019) for as available.   Blanchie Serve, PhD Luan Moore, PhD LP Clinical Psychologist, [facility] [address], [place], [place] [address] [phone]

## 2019-05-01 ENCOUNTER — Telehealth: Payer: Self-pay | Admitting: *Deleted

## 2019-05-01 NOTE — Telephone Encounter (Signed)
DOS 05/02/2019; 09794 - HAMMER TOE REPAIR 4,5 BILATERAL, 99718 - CAPSULOTOMY MPJ RELEASE JOINT 4,5 BILATERAL  [employer]: Effective Date - 08/07/2017  Deductible - $2500 ind (met) / $5000 fam Co-insurance -100% Out of pocket - $6000 met   Spoke to [facility] for benefits - call ref. # [id number]  [id number] - PRE-CERTIFICATION NOT REQUIRED  40684 - PRE-CERTIFICATION NOT REQUIRED

## 2019-05-02 ENCOUNTER — Other Ambulatory Visit: Payer: Self-pay | Admitting: Podiatry

## 2019-05-02 DIAGNOSIS — M2042 Other hammer toe(s) (acquired), left foot: Secondary | ICD-10-CM

## 2019-05-02 DIAGNOSIS — M7752 Other enthesopathy of left foot: Secondary | ICD-10-CM | POA: Diagnosis not present

## 2019-05-02 DIAGNOSIS — M7751 Other enthesopathy of right foot: Secondary | ICD-10-CM | POA: Diagnosis not present

## 2019-05-02 DIAGNOSIS — M2041 Other hammer toe(s) (acquired), right foot: Secondary | ICD-10-CM

## 2019-05-02 MED ORDER — HYDROCODONE-ACETAMINOPHEN 10-325 MG PO TABS: 1.0000 | ORAL_TABLET | Freq: Three times a day (TID) | ORAL | 0 refills | Status: AC | PRN

## 2019-05-02 NOTE — Progress Notes (Signed)
.  postop

## 2019-05-03 ENCOUNTER — Telehealth: Payer: Self-pay | Admitting: *Deleted

## 2019-05-03 ENCOUNTER — Other Ambulatory Visit: Payer: Self-pay | Admitting: Podiatry

## 2019-05-03 MED ORDER — IBUPROFEN 800 MG PO TABS: 800.0000 mg | ORAL_TABLET | Freq: Three times a day (TID) | ORAL | 0 refills | Status: DC | PRN

## 2019-05-03 NOTE — Telephone Encounter (Signed)
Rx Motrin 800 sent to pharmacy to take TID in addition to Vicodin 10/325mg . - Dr. Amalia Hailey

## 2019-05-03 NOTE — Progress Notes (Signed)
.  postop

## 2019-05-03 NOTE — Telephone Encounter (Signed)
Patient had surgery yesterday and feels the pain meds are not helping at all. He didn't get any sleep last night. Please advise.

## 2019-05-08 ENCOUNTER — Other Ambulatory Visit: Payer: Self-pay

## 2019-05-08 ENCOUNTER — Ambulatory Visit (INDEPENDENT_AMBULATORY_CARE_PROVIDER_SITE_OTHER): Payer: Managed Care, Other (non HMO) | Admitting: Podiatry

## 2019-05-08 ENCOUNTER — Ambulatory Visit (INDEPENDENT_AMBULATORY_CARE_PROVIDER_SITE_OTHER): Payer: Managed Care, Other (non HMO)

## 2019-05-08 VITALS — Temp 98.7°F

## 2019-05-08 DIAGNOSIS — Z09 Encounter for follow-up examination after completed treatment for conditions other than malignant neoplasm: Secondary | ICD-10-CM

## 2019-05-08 DIAGNOSIS — M2041 Other hammer toe(s) (acquired), right foot: Secondary | ICD-10-CM | POA: Diagnosis not present

## 2019-05-08 DIAGNOSIS — M2042 Other hammer toe(s) (acquired), left foot: Secondary | ICD-10-CM

## 2019-05-08 MED ORDER — HYDROCODONE-ACETAMINOPHEN 10-325 MG PO TABS: 1.0000 | ORAL_TABLET | Freq: Three times a day (TID) | ORAL | 0 refills | Status: AC | PRN

## 2019-05-13 ENCOUNTER — Encounter: Payer: Self-pay | Admitting: Podiatry

## 2019-05-15 NOTE — Progress Notes (Signed)
   Subjective:  Patient presents today status post bilateral hammertoe repair digits 4 and 5. DOS: 05/02/2019. He states he is experiencing increased pain at night and the left foot hurts worse than the right. There are no modifying factors noted. He has been using the post op shoe bilaterally and taking his pain medication as directed.  Patient is here for further evaluation and treatment.    Past Medical History:  Diagnosis Date  . Meniere syndrome       Objective/Physical Exam Neurovascular status intact.  Skin incisions appear to be well coapted with sutures and staples intact. No sign of infectious process noted. No dehiscence. No active bleeding noted. Moderate edema noted to the surgical extremity.  Radiographic Exam:  Orthopedic hardware and osteotomies sites appear to be stable with routine healing.  Assessment: 1. s/p bilateral hammertoe repair digits 4 and 5. DOS: 05/02/2019   Plan of Care:  1. Patient was evaluated. X-rays reviewed 2. Dressing changed. Keep clean, dry and intact for one week.  3. Continue using post op shoes bilaterally.  4. Return to clinic in one week.    Edrick Kins, DPM [facility]  Dr. Edrick Kins, [address]                                        [place], [place] [address]                Office [phone]  Fax [phone]

## 2019-05-20 ENCOUNTER — Other Ambulatory Visit: Payer: Self-pay

## 2019-05-20 ENCOUNTER — Ambulatory Visit (INDEPENDENT_AMBULATORY_CARE_PROVIDER_SITE_OTHER): Payer: Self-pay | Admitting: Podiatry

## 2019-05-20 DIAGNOSIS — Z09 Encounter for follow-up examination after completed treatment for conditions other than malignant neoplasm: Secondary | ICD-10-CM

## 2019-05-20 DIAGNOSIS — M2042 Other hammer toe(s) (acquired), left foot: Secondary | ICD-10-CM

## 2019-05-20 DIAGNOSIS — M2041 Other hammer toe(s) (acquired), right foot: Secondary | ICD-10-CM

## 2019-05-20 MED ORDER — IBUPROFEN 800 MG PO TABS: 800.0000 mg | ORAL_TABLET | Freq: Three times a day (TID) | ORAL | 0 refills | Status: DC | PRN

## 2019-05-22 NOTE — Progress Notes (Signed)
   Subjective:  Patient presents today status post bilateral hammertoe repair digits 4 and 5. DOS: 05/02/2019. He reports intermittent soreness of the toes. He has been using the post op shoes as directed and denies any modifying factors. Patient is here for further evaluation and treatment.   Past Medical History:  Diagnosis Date  . Meniere syndrome       Objective/Physical Exam Neurovascular status intact.  Skin incisions appear to be well coapted with sutures and staples intact. No sign of infectious process noted. No dehiscence. No active bleeding noted. Moderate edema noted to the surgical extremity.  Assessment: 1. s/p bilateral hammertoe repair digits 4 and 5. DOS: 05/02/2019   Plan of Care:  1. Patient was evaluated.  2. Sutures removed.  3. Continue using post op shoes for three weeks.  4. Patient discussed wanting to fix hammertoes 2 and 3 right foot. Recommended letting feet finish healing prior to additional surgery.  5. Return to clinic in 3 weeks for pin removal.    Edrick Kins, DPM [facility]  Dr. Edrick Kins, [address]                                        [place],  [address]                Office [phone]  Fax [phone]

## 2019-06-12 ENCOUNTER — Other Ambulatory Visit: Payer: Self-pay

## 2019-06-12 ENCOUNTER — Encounter: Payer: Self-pay | Admitting: Podiatry

## 2019-06-12 ENCOUNTER — Ambulatory Visit (INDEPENDENT_AMBULATORY_CARE_PROVIDER_SITE_OTHER): Payer: Managed Care, Other (non HMO) | Admitting: Podiatry

## 2019-06-12 ENCOUNTER — Ambulatory Visit (INDEPENDENT_AMBULATORY_CARE_PROVIDER_SITE_OTHER): Payer: Managed Care, Other (non HMO)

## 2019-06-12 ENCOUNTER — Ambulatory Visit: Payer: Managed Care, Other (non HMO)

## 2019-06-12 VITALS — Temp 98.3°F

## 2019-06-12 DIAGNOSIS — M2041 Other hammer toe(s) (acquired), right foot: Secondary | ICD-10-CM

## 2019-06-12 DIAGNOSIS — Z09 Encounter for follow-up examination after completed treatment for conditions other than malignant neoplasm: Secondary | ICD-10-CM

## 2019-06-12 DIAGNOSIS — M2042 Other hammer toe(s) (acquired), left foot: Secondary | ICD-10-CM | POA: Diagnosis not present

## 2019-06-15 NOTE — Progress Notes (Signed)
   Subjective:  Patient presents today status post bilateral hammertoe repair digits 4 and 5. DOS: 05/02/2019. He states he is doing well. He denies any pain or modifying factors. He denies any new complaints at this time. Patient is here for further evaluation and treatment.   Past Medical History:  Diagnosis Date  . Meniere syndrome       Objective/Physical Exam Neurovascular status intact.  Skin incisions appear to be well coapted. No sign of infectious process noted. No dehiscence. No active bleeding noted. Moderate edema noted to the surgical extremity.  Radiographic Exam:  Osteotomies sites appear to be stable with routine healing.  Assessment: 1. s/p bilateral hammertoe repair digits 4 and 5. DOS: 05/02/2019   Plan of Care:  1. Patient was evaluated. X-Rays reviewed.  2. Percutaneous pins removed.  3. Discontinue using post op shoes.  4. Recommended good shoe gear with arch supports.  5. Return to clinic in 4 weeks.   Works at a [employer]. Set to return to work at the end of August.    Edrick Kins, DPM [facility]  Dr. Edrick Kins, [address]                                        [place], [place] [address]                Office [phone]  Fax [phone]

## 2019-06-26 ENCOUNTER — Encounter

## 2019-06-26 ENCOUNTER — Other Ambulatory Visit: Payer: Self-pay

## 2019-06-26 ENCOUNTER — Ambulatory Visit (INDEPENDENT_AMBULATORY_CARE_PROVIDER_SITE_OTHER): Payer: Managed Care, Other (non HMO) | Admitting: Psychiatry

## 2019-06-26 ENCOUNTER — Encounter: Payer: Self-pay | Admitting: Psychiatry

## 2019-06-26 VITALS — BP 148/103 | HR 98 | Ht 72.0 in | Wt 221.0 lb

## 2019-06-26 DIAGNOSIS — F411 Generalized anxiety disorder: Secondary | ICD-10-CM

## 2019-06-26 DIAGNOSIS — F331 Major depressive disorder, recurrent, moderate: Secondary | ICD-10-CM

## 2019-06-26 DIAGNOSIS — F4010 Social phobia, unspecified: Secondary | ICD-10-CM

## 2019-06-26 DIAGNOSIS — F1010 Alcohol abuse, uncomplicated: Secondary | ICD-10-CM

## 2019-06-26 DIAGNOSIS — F422 Mixed obsessional thoughts and acts: Secondary | ICD-10-CM | POA: Diagnosis not present
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
## 2019-07-03 ENCOUNTER — Encounter: Payer: Self-pay | Admitting: Podiatry

## 2019-07-03 ENCOUNTER — Ambulatory Visit (INDEPENDENT_AMBULATORY_CARE_PROVIDER_SITE_OTHER): Payer: Self-pay | Admitting: Podiatry

## 2019-07-03 ENCOUNTER — Other Ambulatory Visit: Payer: Self-pay

## 2019-07-03 ENCOUNTER — Telehealth: Payer: Self-pay | Admitting: *Deleted

## 2019-07-03 DIAGNOSIS — M2042 Other hammer toe(s) (acquired), left foot: Secondary | ICD-10-CM

## 2019-07-03 DIAGNOSIS — M2041 Other hammer toe(s) (acquired), right foot: Secondary | ICD-10-CM

## 2019-07-03 DIAGNOSIS — Z09 Encounter for follow-up examination after completed treatment for conditions other than malignant neoplasm: Secondary | ICD-10-CM

## 2019-07-03 NOTE — Telephone Encounter (Signed)
Pt states he was seen in office today and Dr. Amalia Hailey put him out another week, and may return to work 07/11/2019, but his short-term disability company states they need to know the reason why he will be out another week and what his restrictions will be and why when he returns to work, call [place] - [phone].

## 2019-07-04 NOTE — Progress Notes (Signed)
   Subjective:  Patient presents today status post bilateral hammertoe repair digits 4 and 5. DOS: 05/02/2019.  Patient states that he is doing very well however he does have some swelling and tenderness to the toes and regular shoe gear.  No new complaints at this time  Past Medical History:  Diagnosis Date  . Meniere syndrome       Objective/Physical Exam Neurovascular status intact.  Skin incisions appear to be well coapted. No sign of infectious process noted. No dehiscence. No active bleeding noted. Moderate edema noted to the surgical extremity.   Assessment: 1. s/p bilateral hammertoe repair digits 4 and 5. DOS: 05/02/2019   Plan of Care:  1. Patient was evaluated.   2.  The patient can discontinue postoperative shoe back into good supportive shoe gear. 3.  Patient is still having some pain and tenderness with swelling to the toes.  We will give him an additional week to transition into his work shoes.  Scheduled return to work will be 07/11/2019.  At that time the patient may return to work full activity no restrictions. 4.  Return to clinic as needed  Works at [employer]. Set to return to work at the end of August.    Edrick Kins, DPM [facility]  Dr. Edrick Kins, [address]                                        [place], [place] [address]                Office [phone]  Fax [phone]

## 2019-07-08 ENCOUNTER — Telehealth: Payer: Self-pay | Admitting: Psychiatry

## 2019-07-08 NOTE — Telephone Encounter (Signed)
I do not have the results.  Could you please print them for me?

## 2019-07-08 NOTE — Telephone Encounter (Signed)
Kien to check on the status of his genesight results.  They are in according to the website.  I can't find his chart. Hiram Comber do you have the chart and results?  If so, he would like to know what you think.  Please call him.  Next appt is 9/28.  If you don't have the result and would like for me to print them of the website let me know.

## 2019-07-09 NOTE — Telephone Encounter (Signed)
I printed the results and put them in your box

## 2019-07-17 NOTE — Telephone Encounter (Signed)
Patient called again today wanting to know the results of the gene sight test and if any meds would be changed oand ordered. Please call him and let him know ASAP. He has a follow up scheduled 9/28

## 2019-07-18 NOTE — Telephone Encounter (Signed)
This is complicated.  It will have to wait until I return to the office.  I have the results.

## 2019-07-19 NOTE — Telephone Encounter (Signed)
Left pt. A VM to return my call.

## 2019-07-19 NOTE — Telephone Encounter (Signed)
Pt. Made aware.

## 2019-07-22 NOTE — Telephone Encounter (Signed)
Telephone call to patient.  Call lasted 20 minutes.  Discussed in some detail the patient's GeneSight test results.  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  Therefore given his OCD genetics would predict the best response would be with clomipramine.  Discussed the side effects of clomipramine in detail.  Discussed this concern about warning labels related to suicidal thoughts associated with psychiatric medications.  He is not having any such thoughts.  He still has his OCD and anxiety and depression.  Clomipramine would be enough effective choice if he tolerates it.  Also the mention the benefits of doing blood levels with try cyclic's could be valuable in identifying the proper dosing.  He wants to do some research on his own and will discuss at his follow-up appointment in about 2 weeks.  He may decide to start the medication ahead of time and we discussed the way clomipramine is typically started with a target dose of 75 mg and then obtaining a blood level.  Lynder Parents MD, DFAPA

## 2019-08-05 ENCOUNTER — Other Ambulatory Visit: Payer: Self-pay

## 2019-08-05 ENCOUNTER — Ambulatory Visit (INDEPENDENT_AMBULATORY_CARE_PROVIDER_SITE_OTHER): Payer: 59 | Admitting: Psychiatry

## 2019-08-05 ENCOUNTER — Encounter: Payer: Self-pay | Admitting: Psychiatry

## 2019-08-05 DIAGNOSIS — F1010 Alcohol abuse, uncomplicated: Secondary | ICD-10-CM

## 2019-08-05 DIAGNOSIS — F422 Mixed obsessional thoughts and acts: Secondary | ICD-10-CM | POA: Diagnosis not present

## 2019-08-05 DIAGNOSIS — F331 Major depressive disorder, recurrent, moderate: Secondary | ICD-10-CM

## 2019-08-05 DIAGNOSIS — F4010 Social phobia, unspecified: Secondary | ICD-10-CM

## 2019-08-05 DIAGNOSIS — F411 Generalized anxiety disorder: Secondary | ICD-10-CM | POA: Diagnosis not present

## 2019-08-05 MED ORDER — CLOMIPRAMINE HCL 25 MG PO CAPS: ORAL_CAPSULE | ORAL | 1 refills | Status: DC

## 2019-08-05 MED ORDER — ALPRAZOLAM 0.5 MG PO TABS: 0.5000 mg | ORAL_TABLET | Freq: Every day | ORAL | 0 refills | Status: DC | PRN

## 2019-08-05 NOTE — Progress Notes (Signed)
Lance Foley QB:[id number] Jun 22, 1986 33 y.o.  Subjective:   Patient ID:  Lance Foley is a 33 y.o. (DOB 1986/02/09) male.  Chief Complaint:  Chief Complaint  Patient presents with  . Anxiety  . OCD    HPI Lance Foley presents to the office today for follow-up of major depression, OCD, generalized anxiety disorder, social anxiety disorder, and alcohol abuse.  He was initially seen on June 26, 2019.  He had a high degree of anxiety over taking medications.  In fact he was fearful of medications.  Therefore he wanted to pursue GeneSight testing versus starting medications that were discussed with him.    On August 31 GeneSight testing was discussed with him in detail.  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   Based on these genetics it was suggested he might have a better response to this clomipramine versus standard SSRIs and that was recommended.  He wanted to research some more and discuss it at this appointment.  He's researched and asks about NDRI vs SSRI and TCA.  Asked questions about genetics.    Pt reports that mood is Anxious and describes anxiety as Severe. Anxiety symptoms include: Excessive Worry, Obsessive Compulsive Symptoms:   as noted last appt,. Pt reports no sleep issues. Pt reports that appetite is good. Pt reports that energy is down significantly and no change and good. Concentration is down slightly. Suicidal thoughts:  denied by patient  Work 4 days a week and drinks only 2-3 beers on off days maybe 6-7 beers. Does not drive after alcohol.    Asks for prn relief med not everyday for severe anxiety.  Took Xanax in the past.   Past Psychiatric Medication Trials: Zoloft SE  Review of Systems:  Review of Systems  Neurological: Negative for tremors and weakness.  Psychiatric/Behavioral: Negative for agitation, confusion,  decreased concentration and suicidal ideas. The patient is nervous/anxious.     Medications: I have reviewed the patient's current medications.  Current Outpatient Medications  Medication Sig Dispense Refill  . Multiple Vitamin (ONE-A-DAY MENS PO) Take by mouth.    . triamterene-hydrochlorothiazide (DYAZIDE) 37.5-25 MG per capsule   4   No current facility-administered medications for this visit.     Medication Side Effects: None  Allergies:  Allergies  Allergen Reactions  . Chantix [Varenicline] Other (See Comments)    intolerant  . Oxycodone-Acetaminophen     REACTION: SEVERE ITCHING  . Penicillins     REACTION: RASH (any "cillins" meds  . Wellbutrin [Bupropion] Hives    Unsure if this was the cause     Past Medical History:  Diagnosis Date  . Meniere syndrome     Family History  Problem Relation Age of Onset  . Depression Mother     Social History   Socioeconomic History  . Marital status: Single    Spouse name: Not on file  . Number of children: Not on file  . Years of education: Not on file  . Highest education level: Not on file  Occupational History  . Not on file  Social Needs  . Financial resource strain: Not on file  . Food insecurity    Worry: Not on file    Inability: Not on file  . Transportation needs    Medical: Not on file    Non-medical: Not on file  Tobacco Use  .  Smoking status: Current Every Day Smoker    Packs/day: 1.00    Years: 15.00    Pack years: 15.00    Types: Cigarettes  . Smokeless tobacco: Never Used  Substance and Sexual Activity  . Alcohol use: Yes    Alcohol/week: 0.0 standard drinks    Comment: beer "couple drinks a day"  . Drug use: No  . Sexual activity: Not on file  Lifestyle  . Physical activity    Days per week: Not on file    Minutes per session: Not on file  . Stress: Not on file  Relationships  . Social Herbalist on phone: Not on file    Gets together: Not on file    Attends religious  service: Not on file    Active member of club or organization: Not on file    Attends meetings of clubs or organizations: Not on file    Relationship status: Not on file  . Intimate partner violence    Fear of current or ex partner: Not on file    Emotionally abused: Not on file    Physically abused: Not on file    Forced sexual activity: Not on file  Other Topics Concern  . Not on file  Social History Narrative  . Not on file    Past Medical History, Surgical history, Social history, and Family history were reviewed and updated as appropriate.   Please see review of systems for further details on the patient's review from today.   Objective:   Physical Exam:  There were no vitals taken for this visit.  Physical Exam Constitutional:      General: He is not in acute distress.    Appearance: He is well-developed.  Musculoskeletal:        General: No deformity.  Neurological:     Mental Status: He is alert and oriented to person, place, and time.     Coordination: Coordination normal.  Psychiatric:        Attention and Perception: Attention and perception normal. He does not perceive auditory or visual hallucinations.        Mood and Affect: Mood is anxious. Mood is not depressed. Affect is not labile, blunt, angry or inappropriate.        Speech: Speech normal.        Behavior: Behavior normal.        Thought Content: Thought content normal. Thought content does not include homicidal or suicidal ideation. Thought content does not include homicidal or suicidal plan.        Cognition and Memory: Cognition and memory normal.        Judgment: Judgment normal.     Comments: Insight intact. No delusions.  OCD.       Lab Review:     Component Value Date/Time   NA 136 09/10/2018 1104   K 4.1 09/10/2018 1104   CL 99 09/10/2018 1104   CO2 28 09/10/2018 1104   GLUCOSE 98 09/10/2018 1104   BUN 12 09/10/2018 1104   CREATININE 0.89 09/10/2018 1104   CALCIUM 9.7 09/10/2018  1104   PROT 7.4 09/10/2018 1104   ALBUMIN 4.7 09/10/2018 1104   AST 71 (H) 09/10/2018 1104   ALT 102 (H) 09/10/2018 1104   ALKPHOS 75 09/10/2018 1104   BILITOT 0.5 09/10/2018 1104   GFRNONAA 98 01/30/2008 1430   GFRAA 119 01/30/2008 1430       Component Value Date/Time   WBC 15.3 (H) 09/10/2018  1104   RBC 4.87 09/10/2018 1104   HGB 16.9 09/10/2018 1104   HCT 49.7 09/10/2018 1104   PLT 293.0 09/10/2018 1104   MCV 102.2 (H) 09/10/2018 1104   MCHC 34.0 09/10/2018 1104   RDW 13.9 09/10/2018 1104   LYMPHSABS 2.4 09/10/2018 1104   MONOABS 1.2 (H) 09/10/2018 1104   EOSABS 0.2 09/10/2018 1104   BASOSABS 0.1 09/10/2018 1104    No results found for: POCLITH, LITHIUM   No results found for: PHENYTOIN, PHENOBARB, VALPROATE, CBMZ   .res Assessment: Plan:    Kalu was seen today for anxiety and ocd.  Diagnoses and all orders for this visit:  Mixed obsessional thoughts and acts  Generalized anxiety disorder  Social anxiety disorder  Major depressive disorder, recurrent episode, moderate (HCC)  No change since his last appointment symptoms continue as noted before.  Extensive discussion Genesight.  Went through each page to 25 minutes.  Discussed higher than usual risk of problems with SSRIs due to the GeneSight genetics noted. Rec therefore clomipramine trial 25 to 75 mg daily and later check level.  He plans to start 3 weeks from today.  About starting and wants to wait to his vacation.  No Xanax with alcohol.  Ok a few prn severe panic/anxiety #12/month.  Again encouraged him to further moderate his alcohol usage.  He is using alcohol to self medicate anxiety.  40 min appt  8 weeks  Lynder Parents, MD, DFAPA   Please see After Visit Summary for patient specific instructions.  Future Appointments  Date Time Provider [place]  08/12/2019 10:15 AM Tower, Wynelle Fanny, MD [facility]  09/16/2019  9:00 AM [facility]  09/20/2019 11:30 AM Tower, Wynelle Fanny, MD [facility]    No orders of the defined types were placed in this encounter.   -------------------------------

## 2019-08-05 NOTE — Patient Instructions (Signed)
L-methylfolate 15 daily.

## 2019-08-12 ENCOUNTER — Other Ambulatory Visit: Payer: Self-pay

## 2019-08-12 ENCOUNTER — Encounter: Payer: Self-pay | Admitting: Family Medicine

## 2019-08-12 ENCOUNTER — Ambulatory Visit: Payer: Managed Care, Other (non HMO) | Admitting: Family Medicine

## 2019-08-12 VITALS — BP 136/88 | HR 97 | Temp 98.3°F | Ht 72.0 in | Wt 220.6 lb

## 2019-08-12 DIAGNOSIS — F1010 Alcohol abuse, uncomplicated: Secondary | ICD-10-CM | POA: Diagnosis not present

## 2019-08-12 DIAGNOSIS — E669 Obesity, unspecified: Secondary | ICD-10-CM

## 2019-08-12 DIAGNOSIS — F429 Obsessive-compulsive disorder, unspecified: Secondary | ICD-10-CM | POA: Diagnosis not present

## 2019-08-12 DIAGNOSIS — F418 Other specified anxiety disorders: Secondary | ICD-10-CM | POA: Diagnosis not present

## 2019-08-12 DIAGNOSIS — B977 Papillomavirus as the cause of diseases classified elsewhere: Secondary | ICD-10-CM | POA: Diagnosis not present

## 2019-08-12 DIAGNOSIS — F17200 Nicotine dependence, unspecified, uncomplicated: Secondary | ICD-10-CM

## 2019-08-12 NOTE — Assessment & Plan Note (Signed)
Overall much improved after stay in rehab  Still some alcohol intake (self medicates his anxiety and depression)  He would like to quit entirely- continues to see psychiatry and psychology  Enc him to go forward with plan to start tx with tricyclic medication  Improved mood may reduce need for etoh

## 2019-08-12 NOTE — Assessment & Plan Note (Signed)
Pt has had various tx failures for genital warts and req ID consult to answer questions He has been immunized  Ref done

## 2019-08-12 NOTE — Assessment & Plan Note (Signed)
In the setting of addiction (alcohol and cigarettes)  Seeing psychiatry and agree with plan to start clomipramine  (also for OCD) Disc poss side effects  With treatment would hope to be able to quit etoh as self treatment  Urged also to continue counseling (he plans to)  Commended on good work so far)  No SI now= he knows what to watch for

## 2019-08-12 NOTE — Patient Instructions (Addendum)
Start regular exercise - at least 30 minutes five days per week  Please cut back or quit alcohol  Once anxiety is treated you may not want to drink   Take care of yourself I did place an ID referral for HPV   Go forward with plan from psychiatry  Continue psychologist visits as well   Keep [place] posted

## 2019-08-12 NOTE — Assessment & Plan Note (Signed)
With maj depression and anx  Seeing psychiatry  Plans to try clomipramine  Had genetic testing showing that ssri may not work well for him

## 2019-08-12 NOTE — Assessment & Plan Note (Signed)
Disc in detail risks of smoking and possible outcomes including copd, vascular/ heart disease, cancer , respiratory and sinus infections  Pt voices understanding Plans to work on mood and etoh before smoking

## 2019-08-12 NOTE — Assessment & Plan Note (Signed)
Discussed how this problem influences overall health and the risks it imposes  Reviewed plan for weight loss with lower calorie diet (via better food choices and also portion control or program like weight watchers) and exercise building up to or more than 30 minutes 5 days per week including some aerobic activity   Changes in diet so far not helping wt  Needs to add exercise  Also I suspect etoh calories are adding on- hopes to quit this as mood improved Disc plan for adding exercise
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
## 2019-08-15 ENCOUNTER — Other Ambulatory Visit: Payer: Self-pay | Admitting: Psychiatry

## 2019-08-15 ENCOUNTER — Telehealth: Payer: Self-pay | Admitting: Psychiatry

## 2019-08-15 MED ORDER — LORAZEPAM 0.5 MG PO TABS: 0.5000 mg | ORAL_TABLET | Freq: Three times a day (TID) | ORAL | 0 refills | Status: DC | PRN

## 2019-08-15 NOTE — Telephone Encounter (Signed)
Okay we will switch from Xanax to Ativan 0.5 mg 1-2 twice daily as needed.  Prescription sent in.  Please inform patient

## 2019-08-15 NOTE — Telephone Encounter (Signed)
Patient been taking Xanax since 09/28 he's taken the medication 2x since then but he stated it puts him in a irritable bad mood, would like to know if he can try something else.  Patient going out of town for a week leaving Monday

## 2019-08-25 ENCOUNTER — Other Ambulatory Visit: Payer: Self-pay | Admitting: Psychiatry

## 2019-08-26 NOTE — Telephone Encounter (Signed)
90 day ok?

## 2019-09-03 ENCOUNTER — Encounter: Payer: Self-pay | Admitting: Infectious Diseases

## 2019-09-03 ENCOUNTER — Ambulatory Visit: Payer: Managed Care, Other (non HMO) | Admitting: Infectious Diseases

## 2019-09-03 ENCOUNTER — Other Ambulatory Visit: Payer: Self-pay

## 2019-09-03 VITALS — BP 159/112 | HR 109 | Wt 214.0 lb

## 2019-09-03 DIAGNOSIS — B977 Papillomavirus as the cause of diseases classified elsewhere: Secondary | ICD-10-CM

## 2019-09-03 DIAGNOSIS — F1010 Alcohol abuse, uncomplicated: Secondary | ICD-10-CM | POA: Diagnosis not present

## 2019-09-03 NOTE — Patient Instructions (Signed)
Reduce alcohol intake to no more than one 6 pack a week to improve immune function. Let us or your primary care physician if you reconsider using aldara cream to lesions.
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
## 2019-09-04 ENCOUNTER — Encounter: Payer: Self-pay | Admitting: Infectious Diseases

## 2019-09-11 ENCOUNTER — Telehealth: Payer: Self-pay | Admitting: Family Medicine

## 2019-09-11 NOTE — Telephone Encounter (Signed)
I need a diagnosis code.  What symptoms is he having?

## 2019-09-11 NOTE — Telephone Encounter (Signed)
Patient has a lab appointment scheduled for Monday 11/9 He would like to know if he can have his Testerone checked also at this appointment

## 2019-09-12 ENCOUNTER — Telehealth: Payer: Self-pay | Admitting: Family Medicine

## 2019-09-12 DIAGNOSIS — R7303 Prediabetes: Secondary | ICD-10-CM

## 2019-09-12 DIAGNOSIS — Z0000 Encounter for general adult medical examination without abnormal findings: Secondary | ICD-10-CM

## 2019-09-12 DIAGNOSIS — E7800 Pure hypercholesterolemia, unspecified: Secondary | ICD-10-CM

## 2019-09-12 DIAGNOSIS — R7401 Elevation of levels of liver transaminase levels: Secondary | ICD-10-CM

## 2019-09-12 DIAGNOSIS — E349 Endocrine disorder, unspecified: Secondary | ICD-10-CM

## 2019-09-12 NOTE — Telephone Encounter (Signed)
-----   Message from Ellamae Sia sent at 09/11/2019 11:52 AM EST ----- Regarding: lab orders for Monday,11.9.20 Patient is scheduled for CPX labs, please order future labs, Thanks , Karna Christmas

## 2019-09-12 NOTE — Telephone Encounter (Signed)
I put the orders in 

## 2019-09-12 NOTE — Telephone Encounter (Signed)
Pt said he isn't having any sxs he just has a diagnosis of Testosterone deficiency and he hasn't had his levels checked in over 2 years so he would like them checked with he comes in for labs on Monday

## 2019-09-12 NOTE — Telephone Encounter (Signed)
Left VM requesting pt to call the office  

## 2019-09-16 ENCOUNTER — Other Ambulatory Visit (INDEPENDENT_AMBULATORY_CARE_PROVIDER_SITE_OTHER): Payer: Managed Care, Other (non HMO)

## 2019-09-16 DIAGNOSIS — Z0000 Encounter for general adult medical examination without abnormal findings: Secondary | ICD-10-CM

## 2019-09-16 DIAGNOSIS — R7303 Prediabetes: Secondary | ICD-10-CM | POA: Diagnosis not present

## 2019-09-16 DIAGNOSIS — E7800 Pure hypercholesterolemia, unspecified: Secondary | ICD-10-CM

## 2019-09-16 DIAGNOSIS — R7401 Elevation of levels of liver transaminase levels: Secondary | ICD-10-CM

## 2019-09-16 DIAGNOSIS — E349 Endocrine disorder, unspecified: Secondary | ICD-10-CM | POA: Diagnosis not present

## 2019-09-16 LAB — COMPREHENSIVE METABOLIC PANEL
ALT: 29 U/L (ref 0–53)
AST: 19 U/L (ref 0–37)
Albumin: 4.6 g/dL (ref 3.5–5.2)
Alkaline Phosphatase: 102 U/L (ref 39–117)
BUN: 21 mg/dL (ref 6–23)
CO2: 31 mEq/L (ref 19–32)
Calcium: 9.8 mg/dL (ref 8.4–10.5)
Chloride: 98 mEq/L (ref 96–112)
Creatinine, Ser: 0.89 mg/dL (ref 0.40–1.50)
GFR: 97.03 mL/min (ref >60.00)
Glucose, Bld: 96 mg/dL (ref 70–99)
Potassium: 4.1 mEq/L (ref 3.5–5.1)
Sodium: 138 mEq/L (ref 135–145)
Total Bilirubin: 0.4 mg/dL (ref 0.2–1.2)
Total Protein: 6.8 g/dL (ref 6.0–8.3)

## 2019-09-16 LAB — TSH: TSH: 1.46 u[IU]/mL (ref 0.35–4.50)

## 2019-09-16 LAB — LIPID PANEL
Cholesterol: 282 mg/dL — ABNORMAL HIGH (ref 0–200)
HDL: 51 mg/dL (ref >39.00)
LDL Cholesterol: 202 mg/dL — ABNORMAL HIGH (ref 0–99)
NonHDL: 230.55
Total CHOL/HDL Ratio: 6
Triglycerides: 141 mg/dL (ref 0.0–149.0)
VLDL: 28.2 mg/dL (ref 0.0–40.0)

## 2019-09-16 LAB — CBC WITH DIFFERENTIAL/PLATELET
Basophils Absolute: 0.1 10*3/uL (ref 0.0–0.1)
Basophils Relative: 0.8 % (ref 0.0–3.0)
Eosinophils Absolute: 0.2 10*3/uL (ref 0.0–0.7)
Eosinophils Relative: 1.5 % (ref 0.0–5.0)
HCT: 48.1 % (ref 39.0–52.0)
Hemoglobin: 16.5 g/dL (ref 13.0–17.0)
Lymphocytes Relative: 16.2 % (ref 12.0–46.0)
Lymphs Abs: 2.5 10*3/uL (ref 0.7–4.0)
MCHC: 34.2 g/dL (ref 30.0–36.0)
MCV: 94.3 fl (ref 78.0–100.0)
Monocytes Absolute: 1 10*3/uL (ref 0.1–1.0)
Monocytes Relative: 6.5 % (ref 3.0–12.0)
Neutro Abs: 11.8 10*3/uL — ABNORMAL HIGH (ref 1.4–7.7)
Neutrophils Relative %: 75 % (ref 43.0–77.0)
Platelets: 244 10*3/uL (ref 150.0–400.0)
RBC: 5.1 Mil/uL (ref 4.22–5.81)
RDW: 13.2 % (ref 11.5–15.5)
WBC: 15.7 10*3/uL — ABNORMAL HIGH (ref 4.0–10.5)

## 2019-09-16 LAB — HEMOGLOBIN A1C: Hgb A1c MFr Bld: 5.6 % (ref 4.6–6.5)

## 2019-09-16 LAB — TESTOSTERONE: Testosterone: 296.68 ng/dL — ABNORMAL LOW (ref 300.00–890.00)

## 2019-09-20 ENCOUNTER — Ambulatory Visit (INDEPENDENT_AMBULATORY_CARE_PROVIDER_SITE_OTHER): Payer: Managed Care, Other (non HMO) | Admitting: Family Medicine

## 2019-09-20 ENCOUNTER — Other Ambulatory Visit: Payer: Self-pay

## 2019-09-20 ENCOUNTER — Encounter: Payer: Self-pay | Admitting: Family Medicine

## 2019-09-20 VITALS — BP 138/88 | HR 99 | Temp 98.2°F | Ht 68.5 in | Wt 214.0 lb

## 2019-09-20 DIAGNOSIS — B977 Papillomavirus as the cause of diseases classified elsewhere: Secondary | ICD-10-CM

## 2019-09-20 DIAGNOSIS — E349 Endocrine disorder, unspecified: Secondary | ICD-10-CM

## 2019-09-20 DIAGNOSIS — H8109 Meniere's disease, unspecified ear: Secondary | ICD-10-CM | POA: Diagnosis not present

## 2019-09-20 DIAGNOSIS — E669 Obesity, unspecified: Secondary | ICD-10-CM

## 2019-09-20 DIAGNOSIS — D72825 Bandemia: Secondary | ICD-10-CM

## 2019-09-20 DIAGNOSIS — F17200 Nicotine dependence, unspecified, uncomplicated: Secondary | ICD-10-CM

## 2019-09-20 DIAGNOSIS — F429 Obsessive-compulsive disorder, unspecified: Secondary | ICD-10-CM

## 2019-09-20 DIAGNOSIS — Z0000 Encounter for general adult medical examination without abnormal findings: Secondary | ICD-10-CM

## 2019-09-20 DIAGNOSIS — E7800 Pure hypercholesterolemia, unspecified: Secondary | ICD-10-CM

## 2019-09-20 DIAGNOSIS — F1010 Alcohol abuse, uncomplicated: Secondary | ICD-10-CM

## 2019-09-20 DIAGNOSIS — R7303 Prediabetes: Secondary | ICD-10-CM

## 2019-09-20 MED ORDER — NICOTINE 21 MG/24HR TD PT24: 21.0000 mg | MEDICATED_PATCH | Freq: Every day | TRANSDERMAL | 1 refills | Status: DC

## 2019-09-20 NOTE — Patient Instructions (Addendum)
Nicotine replacement is a good idea for you  Don't smoke with the patch on Let us know when you are ready to step down  Cholesterol is up despite better diet  See the info re: issues with this and risks Here is a handout on lipitor- let us know if you want to try it   I want to keep an eye on your blood pressure Healthy habits help that   Take care of yourself
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
## 2019-09-22 ENCOUNTER — Encounter: Payer: Self-pay | Admitting: Family Medicine

## 2019-09-22 NOTE — Assessment & Plan Note (Signed)
Pt did see ID for this It was recommended he quit etoh due to it's impact on his immune fxn

## 2019-09-22 NOTE — Assessment & Plan Note (Signed)
Reviewed health habits including diet and exercise and skin cancer prevention Reviewed appropriate screening tests for age  Also reviewed health mt list, fam hx and immunization status , as well as social and family history    Declines flu and pna vaccines unfortunately  bp is borderline- will continue to watch  Disc smoking and etoh cessation for better health

## 2019-09-22 NOTE — Assessment & Plan Note (Signed)
Pt has been through rehab in the past Currently drinks about a case of beer per week Has no desire to cut etoh further  Enc him to consider it for better health  Denies any impact on his fxn LFTs are in the normal range

## 2019-09-22 NOTE — Assessment & Plan Note (Signed)
Lab Results  Component Value Date   HGBA1C 5.6 09/16/2019  up slt from 5.4 disc imp of low glycemic diet and wt loss to prevent DM2

## 2019-09-22 NOTE — Assessment & Plan Note (Signed)
Continues to see psychiatry /Dr Clovis Pu Continues clomipramine with some improvement Enc him to cut etoh further

## 2019-09-22 NOTE — Assessment & Plan Note (Signed)
No changes Continues on diazide

## 2019-09-22 NOTE — Assessment & Plan Note (Signed)
Level is just shy of ref range Not treating now  Not interested presently

## 2019-09-22 NOTE — Assessment & Plan Note (Signed)
Discussed how this problem influences overall health and the risks it imposes  Reviewed plan for weight loss with lower calorie diet (via better food choices and also portion control or program like weight watchers) and exercise building up to or more than 30 minutes 5 days per week including some aerobic activity    

## 2019-09-22 NOTE — Assessment & Plan Note (Signed)
Likely somewhat hereditary LDL is 202 even with self reported good diet  Given info on atorvastatin as well as cholesterol and diet changes  He will consider it and let us know  Disc goals for lipids and reasons to control them Rev last labs with pt Rev low sat fat diet in detail  With smoking-this inc his vascular risks more

## 2019-09-22 NOTE — Assessment & Plan Note (Signed)
Wbc 15.7  Persistent  Has seen hematology and thought due to smoking  No symptoms of infx

## 2019-09-22 NOTE — Assessment & Plan Note (Signed)
Disc in detail risks of smoking and possible outcomes including copd, vascular/ heart disease, cancer , respiratory and sinus infections  Pt voices understanding He would like to think about quitting Px for nicoderm patch given -will update when ready to step down  inst not to smoke with patch-he is aware

## 2019-09-23 ENCOUNTER — Telehealth: Payer: Self-pay | Admitting: *Deleted

## 2019-09-23 NOTE — Telephone Encounter (Signed)
-----   Message from [name], MD sent at 09/22/2019 10:41 AM EST ----- I gave him info on atorvastatin to reviewPlease call and see if he is open to it for tx of cholesterol and I will send in if he is Thanks

## 2019-09-23 NOTE — Telephone Encounter (Signed)
Pt said he is still thinking about it but right now he does not want to try any meds. I advise pt if he changes his mind and wants to try the atorvastatin to call us back and let us know, pt verbalized understanding

## 2019-09-27 ENCOUNTER — Other Ambulatory Visit: Payer: Self-pay | Admitting: Psychiatry

## 2019-09-30 ENCOUNTER — Ambulatory Visit (INDEPENDENT_AMBULATORY_CARE_PROVIDER_SITE_OTHER): Payer: 59 | Admitting: Psychiatry

## 2019-09-30 ENCOUNTER — Encounter: Payer: Self-pay | Admitting: Psychiatry

## 2019-09-30 ENCOUNTER — Other Ambulatory Visit: Payer: Self-pay

## 2019-09-30 DIAGNOSIS — F422 Mixed obsessional thoughts and acts: Secondary | ICD-10-CM

## 2019-09-30 DIAGNOSIS — F5105 Insomnia due to other mental disorder: Secondary | ICD-10-CM

## 2019-09-30 MED ORDER — TRAZODONE HCL 50 MG PO TABS: 50.0000 mg | ORAL_TABLET | Freq: Every evening | ORAL | 1 refills | Status: DC | PRN

## 2019-09-30 MED ORDER — LORAZEPAM 0.5 MG PO TABS: 0.5000 mg | ORAL_TABLET | Freq: Three times a day (TID) | ORAL | 0 refills | Status: DC | PRN

## 2019-09-30 MED ORDER — CLOMIPRAMINE HCL 25 MG PO CAPS: 75.0000 mg | ORAL_CAPSULE | Freq: Every day | ORAL | 1 refills | Status: DC

## 2019-09-30 NOTE — Patient Instructions (Signed)
t

## 2019-09-30 NOTE — Progress Notes (Signed)
Lance Foley [id number] 07-09-1986 33 y.o.  Subjective:   Patient ID:  Lance Foley is a 33 y.o. (DOB December 22, 1985) male.  Chief Complaint:  Chief Complaint  Patient presents with  . Follow-up    Medication Management  . Depression    Medication Management  . Anxiety    Medication Management    HPI Lance Foley presents to the office today for follow-up of major depression, OCD, generalized anxiety disorder, social anxiety disorder, and alcohol abuse.  He was initially seen on June 26, 2019.  He had a high degree of anxiety over taking medications.  In fact he was fearful of medications.  Therefore he wanted to pursue GeneSight testing versus starting medications that were discussed with him.    On August 31 GeneSight testing was discussed with him in detail.  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   Based on these genetics it was suggested he might have a better response to this clomipramine versus standard SSRIs and that was recommended.  He wanted to research some more and discuss it at this appointment.  He was last seen August 05, 2019.  Because of the genetic variations noted it was suggested that he try clomipramine first rather than go with a standard SSRI based on the genetics.  He wanted to defer starting that.  He called back October 8 stating he had tried Xanax for anxiety but it made him irritable and wanted to try an alternative so he was prescribed lorazepam 0.5 mg tablets 1-2 twice daily as needed.  He was cautioned not to take this with alcohol.  On clomipramine 75 mg HS for 3 weeks.  Early SE resolved except tiredness at times but other times bursts of energy from nowhere. Poor after 3 hours of good sleep.  Restless sleep thereafter.   Is sleepy daytime at times.  No one else notices him as hyper.   Less worry and anxiety  with the clomipramine but no improvement in OCD yet.  Overall does feel better than a month ago.  Ativan 2 tablets every other week.  PE 2 weeks ago was good.  Pt reports that mood is Anxious and describes anxiety as Severe. Anxiety symptoms include: Excessive Worry, Obsessive Compulsive Symptoms:   as noted last appt,. Pt reports no sleep issues. Pt reports that appetite is good. Pt reports that energy is down significantly and no change and good. Concentration is down slightly. Suicidal thoughts:  denied by patient  Work 4 days a week and drinks only 2-3 beers on off days maybe 6-7 beers. No change since last here. Does not drive after alcohol.    Asks for prn relief med not everyday for severe anxiety.    Past Psychiatric Medication Trials: Zoloft SE, alprazolam irritable, lorazepam ? Trazodone  Review of Systems:  Review of Systems  Neurological: Negative for tremors and weakness.  Psychiatric/Behavioral: Negative for agitation, confusion, decreased concentration and suicidal ideas. The patient is nervous/anxious.     Medications: I have reviewed the patient's current medications.  Current Outpatient Medications  Medication Sig Dispense Refill  . clomiPRAMINE (ANAFRANIL) 25 MG capsule Take 3 capsules (75 mg total) by mouth at bedtime. 90 capsule 1  . LORazepam (ATIVAN) 0.5 MG tablet Take 1-2 tablets (0.5-1 mg total) by mouth every 8 (eight) hours as needed for anxiety. 15 tablet 0  . Multiple  Vitamin (ONE-A-DAY MENS PO) Take by mouth.    . nicotine (NICODERM CQ) 21 mg/24hr patch Place 1 patch (21 mg total) onto the skin daily. 28 patch 1  . triamterene-hydrochlorothiazide (DYAZIDE) 37.5-25 MG per capsule   4  . traZODone (DESYREL) 50 MG tablet Take 1-2 tablets (50-100 mg total) by mouth at bedtime as needed for sleep. 60 tablet 1   No current facility-administered medications for this visit.     Medication Side Effects: None except dryiness  Allergies:  Allergies   Allergen Reactions  . Chantix [Varenicline] Other (See Comments)    intolerant  . Oxycodone-Acetaminophen     REACTION: SEVERE ITCHING  . Penicillins     REACTION: RASH (any "cillins" meds  . Wellbutrin [Bupropion] Hives    Unsure if this was the cause     Past Medical History:  Diagnosis Date  . 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 Meniere syndrome   . Substance abuse ([place])    alcohol abuse, active    Family History  Problem Relation Age of Onset  . Depression Mother   . Obesity Mother   . Healthy Sister     Social History   Socioeconomic History  . Marital status: Single    Spouse name: Not on file  . Number of children: Not on file  . Years of education: Not on file  . Highest education level: Not on file  Occupational History  . Occupation: Furniture conservator/restorer  Social Needs  . Financial resource strain: Not on file  . Food insecurity    Worry: Not on file    Inability: Not on file  . Transportation needs    Medical: Not on file    Non-medical: Not on file  Tobacco Use  . Smoking status: Current Every Day Smoker    Packs/day: 1.00    Years: 15.00    Pack years: 15.00    Types: Cigarettes  . Smokeless tobacco: Never Used  Substance and Sexual Activity  . Alcohol use: Yes    Alcohol/week: 168.0 standard drinks    Types: 168 Cans of beer per week    Comment: drinks a case of beer daily  . Drug use: Not Currently  . Sexual activity: Not Currently    Birth control/protection: Condom  Lifestyle  . Physical activity    Days per week: Not on file    Minutes per session: Not on file  . Stress: Not on file  Relationships  . Social Herbalist on phone: Not on file    Gets together: Not on file    Attends religious service: Not on file    Active member of club or organization: Not on file    Attends meetings of clubs or organizations: Not on file    Relationship status: Not on file  . Intimate partner violence    Fear of current or ex partner:  Not on file    Emotionally abused: Not on file    Physically abused: Not on file    Forced sexual activity: Not on file  Other Topics Concern  . Not on file  Social History Narrative  . Not on file    Past Medical History, Surgical history, Social history, and Family history were reviewed and updated as appropriate.   Please see review of systems for further details on the patient's review from today.   Objective:   Physical Exam:  There were no  vitals taken for this visit.  Physical Exam Constitutional:      General: He is not in acute distress.    Appearance: He is well-developed.  Musculoskeletal:        General: No deformity.  Neurological:     Mental Status: He is alert and oriented to person, place, and time.     Coordination: Coordination normal.  Psychiatric:        Attention and Perception: Attention and perception normal. He does not perceive auditory or visual hallucinations.        Mood and Affect: Mood is anxious. Mood is not depressed. Affect is not labile, blunt, angry or inappropriate.        Speech: Speech normal.        Behavior: Behavior normal.        Thought Content: Thought content normal. Thought content does not include homicidal or suicidal ideation. Thought content does not include homicidal or suicidal plan.        Cognition and Memory: Cognition and memory normal.        Judgment: Judgment normal.     Comments: Insight intact. No delusions.  OCD.       Lab Review:     Component Value Date/Time   NA 138 09/16/2019 0900   K 4.1 09/16/2019 0900   CL 98 09/16/2019 0900   CO2 31 09/16/2019 0900   GLUCOSE 96 09/16/2019 0900   BUN 21 09/16/2019 0900   CREATININE 0.89 09/16/2019 0900   CALCIUM 9.8 09/16/2019 0900   PROT 6.8 09/16/2019 0900   ALBUMIN 4.6 09/16/2019 0900   AST 19 09/16/2019 0900   ALT 29 09/16/2019 0900   ALKPHOS 102 09/16/2019 0900   BILITOT 0.4 09/16/2019 0900   GFRNONAA 98 01/30/2008 1430   GFRAA 119 01/30/2008 1430        Component Value Date/Time   WBC 15.7 (H) 09/16/2019 0900   RBC 5.10 09/16/2019 0900   HGB 16.5 09/16/2019 0900   HCT 48.1 09/16/2019 0900   PLT 244.0 09/16/2019 0900   MCV 94.3 09/16/2019 0900   MCHC 34.2 09/16/2019 0900   RDW 13.2 09/16/2019 0900   LYMPHSABS 2.5 09/16/2019 0900   MONOABS 1.0 09/16/2019 0900   EOSABS 0.2 09/16/2019 0900   BASOSABS 0.1 09/16/2019 0900    No results found for: POCLITH, LITHIUM   No results found for: PHENYTOIN, PHENOBARB, VALPROATE, CBMZ   .res Assessment: Plan:    Lance Foley was seen today for follow-up, depression and anxiety.  Diagnoses and all orders for this visit:  Mixed obsessional thoughts and acts -     Clomipramine and metabolite, serum -     LORazepam (ATIVAN) 0.5 MG tablet; Take 1-2 tablets (0.5-1 mg total) by mouth every 8 (eight) hours as needed for anxiety. -     clomiPRAMINE (ANAFRANIL) 25 MG capsule; Take 3 capsules (75 mg total) by mouth at bedtime.  Insomnia due to mental condition -     traZODone (DESYREL) 50 MG tablet; Take 1-2 tablets (50-100 mg total) by mouth at bedtime as needed for sleep.       Reviewed discussion Genesight.   Discussed higher than usual risk of problems with SSRIs due to the GeneSight genetics noted. Increase therefore clomipramine to 75 mg daily and later check level.    No Xanax with alcohol.  Ok a few prn severe panic/anxiety #12/month.  Again encouraged him to further moderate his alcohol usage.  He is using alcohol to self medicate anxiety.  Trial moving clomipramine away from bedtime to see if sleep is better.    Trazodone 50 mg 1-2 nightly If fails then trial hydroxyzine 25 mg.  Check clompramine level to guide dosing.  8 weeks  Lynder Parents, MD, DFAPA   Please see After Visit Summary for patient specific instructions.  No future appointments.  Orders Placed This Encounter  Procedures  . Clomipramine and metabolite, serum    -------------------------------

## 2019-10-02 LAB — CLOMIPRAMINE AND METABOLITE, SERUM
Clomipramine Lvl: 92 ng/mL (ref 70–200)
Norclomipramine,S: 58 ng/mL — ABNORMAL LOW (ref 150–300)
Total (Clo+Norclo): 150 ng/mL — ABNORMAL LOW (ref 220–500)

## 2019-10-07 ENCOUNTER — Encounter: Payer: Self-pay | Admitting: Psychiatry

## 2019-10-08 ENCOUNTER — Telehealth: Payer: Self-pay | Admitting: Psychiatry

## 2019-10-08 NOTE — Telephone Encounter (Signed)
Pt called to check status on lab results. Labs 09/30/2019

## 2019-10-08 NOTE — Telephone Encounter (Signed)
His clomipramine level is in the very low end of the normal range.  It might be helpful at that dosage but if he can tolerate increasing the clomipramine to 100 mg daily it is more likely to be helpful.

## 2019-10-09 ENCOUNTER — Other Ambulatory Visit: Payer: Self-pay

## 2019-10-09 MED ORDER — MIRTAZAPINE 7.5 MG PO TABS: 7.5000 mg | ORAL_TABLET | Freq: Every day | ORAL | 0 refills | Status: DC

## 2019-10-09 NOTE — Telephone Encounter (Signed)
DC trazodone.  Please send in prescription for mirtazapine 7.5 mg nightly

## 2019-10-09 NOTE — Telephone Encounter (Signed)
Patient notified and Rx submitted

## 2019-10-21 ENCOUNTER — Telehealth: Payer: Self-pay | Admitting: Psychiatry

## 2019-10-21 NOTE — Telephone Encounter (Signed)
Pt has increased the clomipramine since last visit. He does not notice any difference, notes that around 3pm it wears off.  Can't take it at night because he can't sleep.

## 2019-10-23 NOTE — Telephone Encounter (Signed)
HAs he gotten up to 100 mg clomipramine.?  He can take it anytime of day that he prefers if it bothers his sleep at night he can take it in the morning..  If he can otherwise tolerated he can go up to 150 mg daily but is only been 3 weeks since the increase.  It is likely to need 6 weeks total from the last dose change.

## 2019-10-24 NOTE — Telephone Encounter (Signed)
Pt. Is at 100 Mg and will increase to 150 Mg in the morning. Will talk more about it with you on 11/12/19 at his appt.

## 2019-10-26 ENCOUNTER — Other Ambulatory Visit: Payer: Self-pay | Admitting: Psychiatry

## 2019-10-26 DIAGNOSIS — F422 Mixed obsessional thoughts and acts: Secondary | ICD-10-CM

## 2019-11-02 ENCOUNTER — Other Ambulatory Visit: Payer: Self-pay | Admitting: Psychiatry

## 2019-11-05 NOTE — Telephone Encounter (Signed)
Apt 01/05, requesting 90 day. Just started

## 2019-11-11 ENCOUNTER — Ambulatory Visit: Payer: 59 | Admitting: Psychiatry

## 2019-11-12 ENCOUNTER — Encounter: Payer: Self-pay | Admitting: Psychiatry

## 2019-11-12 ENCOUNTER — Other Ambulatory Visit: Payer: Self-pay

## 2019-11-12 ENCOUNTER — Ambulatory Visit (INDEPENDENT_AMBULATORY_CARE_PROVIDER_SITE_OTHER): Payer: 59 | Admitting: Psychiatry

## 2019-11-12 DIAGNOSIS — F422 Mixed obsessional thoughts and acts: Secondary | ICD-10-CM | POA: Diagnosis not present

## 2019-11-12 DIAGNOSIS — F1010 Alcohol abuse, uncomplicated: Secondary | ICD-10-CM

## 2019-11-12 DIAGNOSIS — F4010 Social phobia, unspecified: Secondary | ICD-10-CM

## 2019-11-12 DIAGNOSIS — F411 Generalized anxiety disorder: Secondary | ICD-10-CM

## 2019-11-12 DIAGNOSIS — F331 Major depressive disorder, recurrent, moderate: Secondary | ICD-10-CM | POA: Diagnosis not present

## 2019-11-12 DIAGNOSIS — F5105 Insomnia due to other mental disorder: Secondary | ICD-10-CM | POA: Diagnosis not present

## 2019-11-12 MED ORDER — ARIPIPRAZOLE 5 MG PO TABS: 5.0000 mg | ORAL_TABLET | Freq: Every day | ORAL | 1 refills | Status: DC

## 2019-11-12 MED ORDER — HYDROXYZINE HCL 25 MG PO TABS: 25.0000 mg | ORAL_TABLET | Freq: Every evening | ORAL | 0 refills | Status: DC | PRN

## 2019-11-12 MED ORDER — CLOMIPRAMINE HCL 25 MG PO CAPS: 125.0000 mg | ORAL_CAPSULE | Freq: Every day | ORAL | 1 refills | Status: DC

## 2019-11-12 NOTE — Progress Notes (Signed)
DECODA DELMASTRO [id number] 1986/02/01 34 y.o.  Subjective:   Patient ID:  Lance Foley is a 34 y.o. (DOB 1986/02/21) male.  Chief Complaint:  Chief Complaint  Patient presents with  . Follow-up    Medication Mangement  . Depression    Medication Mangement  . Other    OCD    HPI NICKHOLAS MOUND presents to the office today for follow-up of major depression, OCD, generalized anxiety disorder, social anxiety disorder, and alcohol abuse.  He was initially seen on June 26, 2019.  He had a high degree of anxiety over taking medications.  In fact he was fearful of medications.  Therefore he wanted to pursue GeneSight testing versus starting medications that were discussed with him.    On August 31 GeneSight testing was discussed with him in detail.  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   Based on these genetics it was suggested he might have a better response to this clomipramine versus standard SSRIs and that was recommended.  He wanted to research some more and discuss it at this appointment.  He was seen August 05, 2019.  Because of the genetic variations noted it was suggested that he try clomipramine first rather than go with a standard SSRI based on the genetics.  He wanted to defer starting that.  He called back October 8 stating he had tried Xanax for anxiety but it made him irritable and wanted to try an alternative so he was prescribed lorazepam 0.5 mg tablets 1-2 twice daily as needed.  He was cautioned not to take this with alcohol.  Last seen September 30, 2019.  On clomipramine 75 mg HS for 3 weeks.  Early SE resolved except tiredness at times but other times bursts of energy from nowhere. Poor after 3 hours of good sleep.  Restless sleep thereafter.   Is sleepy daytime at times.  No one else notices him as hyper.  Ordered serum  clomipramine level at that time which was 92 on a dose of 75 mg daily.  On December 1 he complained of not sleeping well with trazodone having nightmares.  That was switched to mirtazapine 7.5 mg nightly. In addition his low clomipramine level was discussed and it was recommended the increase to 100 mg daily and move it away from bedtime if possible to see if that was contributing to the insomnia.  He called back on December 14 wanting to increase clomipramine again because of not seeing benefit.  It is clearly not been long enough to see full benefit however based on the blood level it was reasonable to increase to 150 mg daily if he could tolerate it so he was given permission to do so.  On 125 mg clomipramine for less than 3 weeks and can't tell much difference.  Doesn't feel it's helping as much as he'd like for depression and OCD but GAD is better with it.  Less worry and anxiety with the clomipramine but no improvement in OCD yet.  Struggles with motivation and depression and OCD and not sure which is worse.  Ativan 2 tablets every other week. 1 mg makes him sleepy.  PE recently was good.  Pt reports that mood is Anxious and describes anxiety as Severe. Anxiety symptoms include: Excessive Worry, Obsessive Compulsive Symptoms:   as noted last appt,. Insomnia with 3 hours sleep/night.   Pt  reports that appetite is good. Pt reports that energy is down significantly and no change and good. Concentration is down slightly. Suicidal thoughts:  denied by patient  No panic since here.  Work 4 days a week and drinks only 2-3 beers on off days maybe 6-7 beers. No change since last here.  Case and a half weekly.  Does not drive after alcohol.    Asks for prn relief med not everyday for severe anxiety.    Past Psychiatric Medication Trials: Zoloft SE, alprazolam irritable, lorazepam Trazodone and mirtazapine bad dreams and tried 3-4 nights. Unisom lost response  Review of Systems:  Review of  Systems  Neurological: Negative for tremors and weakness.  Psychiatric/Behavioral: Negative for agitation, confusion, decreased concentration and suicidal ideas. The patient is nervous/anxious.     Medications: I have reviewed the patient's current medications.  Current Outpatient Medications  Medication Sig Dispense Refill  . clomiPRAMINE (ANAFRANIL) 25 MG capsule Take 5 capsules (125 mg total) by mouth at bedtime. 150 capsule 1  . LORazepam (ATIVAN) 0.5 MG tablet Take 1-2 tablets (0.5-1 mg total) by mouth every 8 (eight) hours as needed for anxiety. 15 tablet 0  . Multiple Vitamin (ONE-A-DAY MENS PO) Take by mouth.    . nicotine (NICODERM CQ) 21 mg/24hr patch Place 1 patch (21 mg total) onto the skin daily. 28 patch 1  . triamterene-hydrochlorothiazide (DYAZIDE) 37.5-25 MG per capsule   4  . ARIPiprazole (ABILIFY) 5 MG tablet Take 1 tablet (5 mg total) by mouth daily. 30 tablet 1  . hydrOXYzine (ATARAX/VISTARIL) 25 MG tablet Take 1-2 tablets (25-50 mg total) by mouth at bedtime as needed. 30 tablet 0   No current facility-administered medications for this visit.    Medication Side Effects: None except more sweating  Allergies:  Allergies  Allergen Reactions  . Chantix [Varenicline] Other (See Comments)    intolerant  . Oxycodone-Acetaminophen     REACTION: SEVERE ITCHING  . Penicillins     REACTION: RASH (any "cillins" meds  . Wellbutrin [Bupropion] Hives    Unsure if this was the cause     Past Medical History:  Diagnosis Date  . 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 Meniere syndrome   . Substance abuse ([place])    alcohol abuse, active    Family History  Problem Relation Age of Onset  . Depression Mother   . Obesity Mother   . Healthy Sister     Social History   Socioeconomic History  . Marital status: Single    Spouse name: Not on file  . Number of children: Not on file  . Years of education: Not on file  . Highest education level: Not on file   Occupational History  . Occupation: Furniture conservator/restorer  Tobacco Use  . Smoking status: Current Every Day Smoker    Packs/day: 1.00    Years: 15.00    Pack years: 15.00    Types: Cigarettes  . Smokeless tobacco: Never Used  Substance and Sexual Activity  . Alcohol use: Yes    Alcohol/week: 168.0 standard drinks    Types: 168 Cans of beer per week    Comment: drinks a case of beer daily  . Drug use: Not Currently  . Sexual activity: Not Currently    Birth control/protection: Condom  Other Topics Concern  . Not on file  Social History Narrative  . Not on file   Social Determinants of Health   Financial  Resource Strain:   . Difficulty of Paying Living Expenses: Not on file  Food Insecurity:   . Worried About Charity fundraiser in the Last Year: Not on file  . Ran Out of Food in the Last Year: Not on file  Transportation Needs:   . Lack of Transportation (Medical): Not on file  . Lack of Transportation (Non-Medical): Not on file  Physical Activity:   . Days of Exercise per Week: Not on file  . Minutes of Exercise per Session: Not on file  Stress:   . Feeling of Stress : Not on file  Social Connections:   . Frequency of Communication with Friends and Family: Not on file  . Frequency of Social Gatherings with Friends and Family: Not on file  . Attends Religious Services: Not on file  . Active Member of Clubs or Organizations: Not on file  . Attends Archivist Meetings: Not on file  . Marital Status: Not on file  Intimate Partner Violence:   . Fear of Current or Ex-Partner: Not on file  . Emotionally Abused: Not on file  . Physically Abused: Not on file  . Sexually Abused: Not on file    Past Medical History, Surgical history, Social history, and Family history were reviewed and updated as appropriate.   Please see review of systems for further details on the patient's review from today.   Objective:   Physical Exam:  There were no vitals taken for this  visit.  Physical Exam Constitutional:      General: He is not in acute distress.    Appearance: He is well-developed.  Musculoskeletal:        General: No deformity.  Neurological:     Mental Status: He is alert and oriented to person, place, and time.     Coordination: Coordination normal.  Psychiatric:        Attention and Perception: Attention and perception normal. He does not perceive auditory or visual hallucinations.        Mood and Affect: Mood is anxious and depressed. Affect is not labile, blunt, angry or inappropriate.        Speech: Speech normal.        Behavior: Behavior normal.        Thought Content: Thought content normal. Thought content does not include homicidal or suicidal ideation. Thought content does not include homicidal or suicidal plan.        Cognition and Memory: Cognition and memory normal.        Judgment: Judgment normal.     Comments: Insight intact. No delusions.  OCD.  Somewhat ruminative.      Lab Review:     Component Value Date/Time   NA 138 09/16/2019 0900   K 4.1 09/16/2019 0900   CL 98 09/16/2019 0900   CO2 31 09/16/2019 0900   GLUCOSE 96 09/16/2019 0900   BUN 21 09/16/2019 0900   CREATININE 0.89 09/16/2019 0900   CALCIUM 9.8 09/16/2019 0900   PROT 6.8 09/16/2019 0900   ALBUMIN 4.6 09/16/2019 0900   AST 19 09/16/2019 0900   ALT 29 09/16/2019 0900   ALKPHOS 102 09/16/2019 0900   BILITOT 0.4 09/16/2019 0900   GFRNONAA 98 01/30/2008 1430   GFRAA 119 01/30/2008 1430       Component Value Date/Time   WBC 15.7 (H) 09/16/2019 0900   RBC 5.10 09/16/2019 0900   HGB 16.5 09/16/2019 0900   HCT 48.1 09/16/2019 0900   PLT 244.0 09/16/2019  0900   MCV 94.3 09/16/2019 0900   MCHC 34.2 09/16/2019 0900   RDW 13.2 09/16/2019 0900   LYMPHSABS 2.5 09/16/2019 0900   MONOABS 1.0 09/16/2019 0900   EOSABS 0.2 09/16/2019 0900   BASOSABS 0.1 09/16/2019 0900    No results found for: POCLITH, LITHIUM   No results found for: PHENYTOIN,  PHENOBARB, VALPROATE, CBMZ   .res Assessment: Plan:    Carlton was seen today for follow-up, depression and other.  Diagnoses and all orders for this visit:  Mixed obsessional thoughts and acts -     clomiPRAMINE (ANAFRANIL) 25 MG capsule; Take 5 capsules (125 mg total) by mouth at bedtime. -     ARIPiprazole (ABILIFY) 5 MG tablet; Take 1 tablet (5 mg total) by mouth daily.  Insomnia due to mental condition -     hydrOXYzine (ATARAX/VISTARIL) 25 MG tablet; Take 1-2 tablets (25-50 mg total) by mouth at bedtime as needed.  Generalized anxiety disorder  Major depressive disorder, recurrent episode, moderate (HCC) -     ARIPiprazole (ABILIFY) 5 MG tablet; Take 1 tablet (5 mg total) by mouth daily.  Social anxiety disorder  Alcohol abuse       Reviewed discussion Genesight.  He wondered how it would affect Rx Abilify  Discussed higher than usual risk of problems with SSRIs due to the GeneSight genetics noted. Clomipramine level 92 at 75 mg .  Should be more solid in normal range at 125 mg with potential to increase to 150 mg.  BC sweating will not increase it.  Some insomnia with it. Taking it in AM now  125 mg AM.  No Ativan with alcohol.  Ok a few prn severe panic/anxiety #12/month.  Again encouraged him to further moderate his alcohol usage.  He is using alcohol to self medicate anxiety.  Option Abilify potentiation to speed recovery.  Discussed potential metabolic side effects associated with atypical antipsychotics, as well as potential risk for movement side effects. Advised pt to contact office if movement side effects occur.  He wants to try it..    DC mirtazapine.  trial hydroxyzine 25 mg. 1-2 HS.  6 weeks  Lynder Parents, MD, DFAPA   Please see After Visit Summary for patient specific instructions.  No future appointments.  No orders of the defined types were placed in this encounter.   -------------------------------

## 2019-11-19 ENCOUNTER — Other Ambulatory Visit: Payer: Self-pay | Admitting: Psychiatry

## 2019-11-19 DIAGNOSIS — F5105 Insomnia due to other mental disorder: Secondary | ICD-10-CM

## 2019-11-26 ENCOUNTER — Telehealth: Payer: Self-pay | Admitting: Psychiatry

## 2019-11-26 NOTE — Telephone Encounter (Signed)
OK stop hydroxyzine.  Trial low dose quetiapine 25 mg tablet 1-2 at night as needed for sleep. #30. No refill.  Please send RX

## 2019-11-26 NOTE — Telephone Encounter (Signed)
Pt is taking hydroxyzine for a bit now. Not helping him with sleep. Wants to try something else?

## 2019-11-27 ENCOUNTER — Other Ambulatory Visit: Payer: Self-pay

## 2019-11-27 MED ORDER — QUETIAPINE FUMARATE 25 MG PO TABS: ORAL_TABLET | ORAL | 0 refills | Status: DC

## 2019-11-27 NOTE — Telephone Encounter (Signed)
RTC to patient and left detailed voicemail. Rx for Quetiapine 25 mg 1-2 at hs prn sleep #30 submitted to [facility]  Instructed to call back with further questions or concerns

## 2019-12-02 ENCOUNTER — Other Ambulatory Visit: Payer: Self-pay

## 2019-12-02 ENCOUNTER — Other Ambulatory Visit: Payer: Self-pay | Admitting: Psychiatry

## 2019-12-02 ENCOUNTER — Ambulatory Visit: Payer: Managed Care, Other (non HMO) | Admitting: Family Medicine

## 2019-12-02 ENCOUNTER — Encounter: Payer: Self-pay | Admitting: Family Medicine

## 2019-12-02 VITALS — BP 140/94 | HR 108 | Temp 97.5°F | Ht 68.5 in | Wt 221.2 lb

## 2019-12-02 DIAGNOSIS — W19XXXA Unspecified fall, initial encounter: Secondary | ICD-10-CM

## 2019-12-02 DIAGNOSIS — F422 Mixed obsessional thoughts and acts: Secondary | ICD-10-CM

## 2019-12-02 DIAGNOSIS — Y92009 Unspecified place in unspecified non-institutional (private) residence as the place of occurrence of the external cause: Secondary | ICD-10-CM | POA: Diagnosis not present

## 2019-12-02 DIAGNOSIS — S0512XA Contusion of eyeball and orbital tissues, left eye, initial encounter: Secondary | ICD-10-CM | POA: Insufficient documentation

## 2019-12-02 NOTE — Assessment & Plan Note (Signed)
From a fall Saturday hitting L face/eye area on corner of furniture  No LOC -only brief headache  Swelling and bruising around L eye is improved No vision changes at all and minimal tenderness of L face Reassuring exam  Pt desired xray- explained that CT is the method of choice for assessing face/sinuses and orbits  With continued improvement doubt that will be necessary  inst pt to watch for changes including increase in swelling or pain , any vision change, any headache or dizziness

## 2019-12-02 NOTE — Patient Instructions (Addendum)
Continue ice and let us know if vision changes or if you have any new symptoms  Also if sinus pain or headaches or dizziness please call and let me know   The bruising will continue to improve  Imaging of choice is a CT scan- if symptoms worsen or fail to improve let me know    Ibuprofen over the counter 400 mg with food up to every 6-8 hours is ok for pain
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
## 2019-12-02 NOTE — Assessment & Plan Note (Signed)
Socks on hard floor-waking up  Disc fall prev  Strongly recommend wearing "house" shoes or slipper with rubber soles to prevent accidents

## 2019-12-03 NOTE — Telephone Encounter (Signed)
Apt 12/02 

## 2019-12-04 ENCOUNTER — Other Ambulatory Visit: Payer: Self-pay | Admitting: Psychiatry

## 2019-12-04 DIAGNOSIS — F422 Mixed obsessional thoughts and acts: Secondary | ICD-10-CM

## 2019-12-04 DIAGNOSIS — F331 Major depressive disorder, recurrent, moderate: Secondary | ICD-10-CM

## 2019-12-04 MED ORDER — CLOMIPRAMINE HCL 25 MG PO CAPS: 125.0000 mg | ORAL_CAPSULE | Freq: Every day | ORAL | 0 refills | Status: DC

## 2019-12-04 NOTE — Telephone Encounter (Signed)
90 day okay for his Abilify?

## 2019-12-05 ENCOUNTER — Other Ambulatory Visit: Payer: Self-pay | Admitting: Psychiatry

## 2019-12-10 ENCOUNTER — Encounter: Payer: Self-pay | Admitting: Psychiatry

## 2019-12-10 ENCOUNTER — Other Ambulatory Visit: Payer: Self-pay

## 2019-12-10 ENCOUNTER — Ambulatory Visit (INDEPENDENT_AMBULATORY_CARE_PROVIDER_SITE_OTHER): Payer: 59 | Admitting: Psychiatry

## 2019-12-10 DIAGNOSIS — F331 Major depressive disorder, recurrent, moderate: Secondary | ICD-10-CM

## 2019-12-10 DIAGNOSIS — F411 Generalized anxiety disorder: Secondary | ICD-10-CM | POA: Diagnosis not present

## 2019-12-10 DIAGNOSIS — F5105 Insomnia due to other mental disorder: Secondary | ICD-10-CM

## 2019-12-10 DIAGNOSIS — F4010 Social phobia, unspecified: Secondary | ICD-10-CM

## 2019-12-10 DIAGNOSIS — F422 Mixed obsessional thoughts and acts: Secondary | ICD-10-CM

## 2019-12-10 DIAGNOSIS — F1010 Alcohol abuse, uncomplicated: Secondary | ICD-10-CM

## 2019-12-10 MED ORDER — LITHIUM CARBONATE 300 MG PO CAPS: ORAL_CAPSULE | ORAL | 1 refills | Status: DC

## 2019-12-10 MED ORDER — CLOMIPRAMINE HCL 50 MG PO CAPS: 150.0000 mg | ORAL_CAPSULE | Freq: Every day | ORAL | 1 refills | Status: DC

## 2019-12-10 NOTE — Patient Instructions (Addendum)
Buspirone 30 mg 1/3 at night for 3 days, then 2/3 nightly for 3 days, then 1 at night for anxiety and sleep  Lithium capsule 1 daily for 1 week then 2 daily for depression  Increase clomipramine to 3 of the 50 mg capsules daily.

## 2019-12-10 NOTE — Progress Notes (Signed)
[name] [id number] 09/26/1986 34 y.o.  Subjective:   Patient ID:  Lance Foley is a 34 y.o. (DOB 05/31/1986) male.  Chief Complaint:  Chief Complaint  Patient presents with  . Anxiety  . Follow-up    med changes  . Sleeping Problem  . Depression    moderate    HPI Lance Foley presents to the office today for follow-up of major depression, OCD, generalized anxiety disorder, social anxiety disorder, and alcohol abuse.  He was initially seen on June 26, 2019.  He had a high degree of anxiety over taking medications.  In fact he was fearful of medications.  Therefore he wanted to pursue GeneSight testing versus starting medications that were discussed with him.    On August 31 GeneSight testing was discussed with him in detail.  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   Based on these genetics it was suggested he might have a better response to this clomipramine versus standard SSRIs and that was recommended.  He wanted to research some more and discuss it at this appointment.  He was seen August 05, 2019.  Because of the genetic variations noted it was suggested that he try clomipramine first rather than go with a standard SSRI based on the genetics.  He wanted to defer starting that.  He called back October 8 stating he had tried Xanax for anxiety but it made him irritable and wanted to try an alternative so he was prescribed lorazepam 0.5 mg tablets 1-2 twice daily as needed.  He was cautioned not to take this with alcohol.  seen September 30, 2019.  On clomipramine 75 mg HS for 3 weeks.  Early SE resolved except tiredness at times but other times bursts of energy from nowhere. Poor after 3 hours of good sleep.  Restless sleep thereafter.   Is sleepy daytime at times.  No one else notices him as hyper.  Ordered serum clomipramine level  at that time which was 92 on a dose of 75 mg daily.  On December 1 he complained of not sleeping well with trazodone having nightmares.  That was switched to mirtazapine 7.5 mg nightly. In addition his low clomipramine level was discussed and it was recommended the increase to 100 mg daily and move it away from bedtime if possible to see if that was contributing to the insomnia.  He called back on December 14 wanting to increase clomipramine again because of not seeing benefit.  It is clearly not been long enough to see full benefit however based on the blood level it was reasonable to increase to 150 mg daily if he could tolerate it so he was given permission to do so.On 125 mg clomipramine for less than 3 weeks and can't tell much difference.  Doesn't feel it's helping as much as he'd like for depression and OCD but GAD is better with it.  Last seen November 12, 2019.  Mirtazapine was changed to hydroxyzine 25-50 nightly for sleep.  Abilify was added to potentiate the antidepressant.  Clomipramine was continued at 125 mg daily.  He called pack complaining of poor sleep on January 19 and hydroxyzine was stopped and quetiapine 25 mg 1-2 nightly was prescribed for treatment resistant insomnia.  Took Abilify 5 mg daily for 3 days and felt more depressed and stopped it.  No other SE.  Didn't see the  point to taking it longer.  Bad experience with Seroquel with vivid dreams and didn't sleep really more.  Struck out in his sleep and fell out of bed.  Never acted out dreams before.  Some difference in OCD with clomipramine but not better with anxiety and depression.  Less checking and leaves house quicker markedly.  Anxiety builds in afternoon with more irritable and negative ruminations and depressive thoughts.  Better in the morning. Sleep bad with 3 hours and toss and turn for hours thereafter.  Ativan 1 mg no longer helps.  No problems with sleep until clomipramine.   Don't feel tired daytime. Asks  about Buspar which made him sleepy in the past.  Less worry and anxiety with the clomipramine but no improvement in OCD yet.  Struggles with motivation and depression and OCD and not sure which is worse.  Ativan 2 tablets every other week. 1 mg makes him sleepy.  PE recently was good.  Pt reports that mood is depressed moderate 6/10  Anxious and describes anxiety as Severe. Anxiety symptoms include: Excessive Worry, Obsessive Compulsive Symptoms:   as noted last appt,. Insomnia with 3 hours sleep/night.   Pt reports that appetite is good. Pt reports that energy is down significantly and no change and good. Concentration is down slightly. Suicidal thoughts:  denied by patient  No panic since here.  Work 4 days a week and drinks only 2-3 beers on off days maybe 6-7 beers. No change since last here.  Case and a half weekly.  Does not drive after alcohol.    Asks for prn relief med not everyday for severe anxiety.    Past Psychiatric Medication Trials: Zoloft SE, alprazolam irritable, lorazepam Trazodone and mirtazapine bad dreams and tried 3-4 nights. Quetiapine 25 bad dreams Wellbutrin rash Low dose Prozac for a year NR [employer] lost response Buspirone sleepy History of Wilber Bihari.  Not seen in 3-4 mos History of Andy Mitchum.   Review of Systems:  Review of Systems  Neurological: Negative for dizziness, tremors and weakness.  Psychiatric/Behavioral: Positive for dysphoric mood. Negative for agitation, confusion, decreased concentration and suicidal ideas. The patient is nervous/anxious.     Medications: I have reviewed the patient's current medications.  Current Outpatient Medications  Medication Sig Dispense Refill  . LORazepam (ATIVAN) 0.5 MG tablet TAKE 1-2 TABLETS (0.5-1 MG TOTAL) BY MOUTH EVERY 8 (EIGHT) HOURS AS NEEDED FOR ANXIETY. 15 tablet 0  . Multiple Vitamin (ONE-A-DAY MENS PO) Take by mouth.    . nicotine (NICODERM CQ) 21 mg/24hr patch Place 1 patch (21 mg total)  onto the skin daily. 28 patch 1  . triamterene-hydrochlorothiazide (DYAZIDE) 37.5-25 MG per capsule   4  . ARIPiprazole (ABILIFY) 5 MG tablet TAKE 1 TABLET BY MOUTH EVERY DAY (Patient not taking: Reported on 12/10/2019) 90 tablet 0  . clomiPRAMINE (ANAFRANIL) 50 MG capsule Take 3 capsules (150 mg total) by mouth daily. 90 capsule 1  . lithium carbonate 300 MG capsule 1 daily for 1 week then 2 daily 60 capsule 1  . QUEtiapine (SEROQUEL) 25 MG tablet Take 1-2 tablets by mouth at bedtime as needed for sleep (Patient not taking: Reported on 12/02/2019) 30 tablet 0   No current facility-administered medications for this visit.    Medication Side Effects" : insomnia, lower sex drive and weight gain, mild sweating  Allergies:  Allergies  Allergen Reactions  . Chantix [Varenicline] Other (See Comments)    intolerant  . Oxycodone-Acetaminophen     REACTION:  SEVERE ITCHING  . Penicillins     REACTION: RASH (any "cillins" meds  . Wellbutrin [Bupropion] Hives    Unsure if this was the cause     Past Medical History:  Diagnosis Date  . 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 Meniere syndrome   . Substance abuse ([place])    alcohol abuse, active    Family History  Problem Relation Age of Onset  . Depression Mother   . Obesity Mother   . Healthy Sister     Social History   Socioeconomic History  . Marital status: Single    Spouse name: Not on file  . Number of children: Not on file  . Years of education: Not on file  . Highest education level: Not on file  Occupational History  . Occupation: Furniture conservator/restorer  Tobacco Use  . Smoking status: Current Every Day Smoker    Packs/day: 1.00    Years: 15.00    Pack years: 15.00    Types: Cigarettes  . Smokeless tobacco: Never Used  Substance and Sexual Activity  . Alcohol use: Yes    Alcohol/week: 168.0 standard drinks    Types: 168 Cans of beer per week    Comment: drinks a case of beer daily  . Drug use: Not Currently  . Sexual  activity: Not Currently    Birth control/protection: Condom  Other Topics Concern  . Not on file  Social History Narrative  . Not on file   Social Determinants of Health   Financial Resource Strain:   . Difficulty of Paying Living Expenses: Not on file  Food Insecurity:   . Worried About Charity fundraiser in the Last Year: Not on file  . Ran Out of Food in the Last Year: Not on file  Transportation Needs:   . Lack of Transportation (Medical): Not on file  . Lack of Transportation (Non-Medical): Not on file  Physical Activity:   . Days of Exercise per Week: Not on file  . Minutes of Exercise per Session: Not on file  Stress:   . Feeling of Stress : Not on file  Social Connections:   . Frequency of Communication with Friends and Family: Not on file  . Frequency of Social Gatherings with Friends and Family: Not on file  . Attends Religious Services: Not on file  . Active Member of Clubs or Organizations: Not on file  . Attends Archivist Meetings: Not on file  . Marital Status: Not on file  Intimate Partner Violence:   . Fear of Current or Ex-Partner: Not on file  . Emotionally Abused: Not on file  . Physically Abused: Not on file  . Sexually Abused: Not on file    Past Medical History, Surgical history, Social history, and Family history were reviewed and updated as appropriate.   Please see review of systems for further details on the patient's review from today.   Objective:   Physical Exam:  There were no vitals taken for this visit.  Physical Exam Constitutional:      General: He is not in acute distress.    Appearance: He is well-developed.  Musculoskeletal:        General: No deformity.  Neurological:     Mental Status: He is alert and oriented to person, place, and time.     Coordination: Coordination normal.  Psychiatric:        Attention and Perception: Attention and perception normal.  He does not perceive auditory or visual hallucinations.         Mood and Affect: Mood is anxious and depressed. Affect is blunt. Affect is not labile, angry or inappropriate.        Speech: Speech normal.        Behavior: Behavior normal.        Thought Content: Thought content normal. Thought content does not include homicidal or suicidal ideation. Thought content does not include homicidal or suicidal plan.        Cognition and Memory: Cognition and memory normal.        Judgment: Judgment normal.     Comments: Insight intact. No delusions.  OCD.  Somewhat ruminative.      Lab Review:     Component Value Date/Time   NA 138 09/16/2019 0900   K 4.1 09/16/2019 0900   CL 98 09/16/2019 0900   CO2 31 09/16/2019 0900   GLUCOSE 96 09/16/2019 0900   BUN 21 09/16/2019 0900   CREATININE 0.89 09/16/2019 0900   CALCIUM 9.8 09/16/2019 0900   PROT 6.8 09/16/2019 0900   ALBUMIN 4.6 09/16/2019 0900   AST 19 09/16/2019 0900   ALT 29 09/16/2019 0900   ALKPHOS 102 09/16/2019 0900   BILITOT 0.4 09/16/2019 0900   GFRNONAA 98 01/30/2008 1430   GFRAA 119 01/30/2008 1430       Component Value Date/Time   WBC 15.7 (H) 09/16/2019 0900   RBC 5.10 09/16/2019 0900   HGB 16.5 09/16/2019 0900   HCT 48.1 09/16/2019 0900   PLT 244.0 09/16/2019 0900   MCV 94.3 09/16/2019 0900   MCHC 34.2 09/16/2019 0900   RDW 13.2 09/16/2019 0900   LYMPHSABS 2.5 09/16/2019 0900   MONOABS 1.0 09/16/2019 0900   EOSABS 0.2 09/16/2019 0900   BASOSABS 0.1 09/16/2019 0900    No results found for: POCLITH, LITHIUM   No results found for: PHENYTOIN, PHENOBARB, VALPROATE, CBMZ   .res Assessment: Plan:    Lance Foley was seen today for anxiety, follow-up, sleeping problem and depression.  Diagnoses and all orders for this visit:  Mixed obsessional thoughts and acts -     clomiPRAMINE (ANAFRANIL) 50 MG capsule; Take 3 capsules (150 mg total) by mouth daily.  Insomnia due to mental condition  Major depressive disorder, recurrent episode, moderate (HCC) -     clomiPRAMINE  (ANAFRANIL) 50 MG capsule; Take 3 capsules (150 mg total) by mouth daily. -     lithium carbonate 300 MG capsule; 1 daily for 1 week then 2 daily  Generalized anxiety disorder -     clomiPRAMINE (ANAFRANIL) 50 MG capsule; Take 3 capsules (150 mg total) by mouth daily.  Social anxiety disorder -     clomiPRAMINE (ANAFRANIL) 50 MG capsule; Take 3 capsules (150 mg total) by mouth daily.  Alcohol abuse    Discussed his ongoing problems with depression, anxiety, OCD, and treatment resistant insomnia as well.  The OCD is partially responsive to the clomipramine.  He is seeing improvement in the OCD.  However the anxiety depression and insomnia persist.  As noted he has had side effects with various things of that have been tried.  He can be somewhat impatient such as was the case with only 3 days of Abilify.  That is in an adequate trial  Reviewed discussion Genesight.    Discussed higher than usual risk of problems with SSRIs due to the GeneSight genetics noted.  Discussed side effects of clomipramine.  He is having  some side effects as noted above.  However increasing the dose could further reduce his anxiety and depression as well as OCD. Clomipramine level 92 at 75 mg .  Should be more solid in normal range at 125 mg with potential to increase to 150 mg.  Some insomnia with it. Taking it in AM now   Increase clomipramine to 150 mg daily.  No Ativan with alcohol.  Ok a few prn severe panic/anxiety #12/month.  Again encouraged him to further moderate his alcohol usage.  He is using alcohol to self medicate anxiety.  Option Abilify potentiation to speed recovery.  Discussed potential metabolic side effects associated with atypical antipsychotics, as well as potential risk for movement side effects. Advised pt to contact office if movement side effects occur.  Option retry bc he didn't give it a chance.    Buspirone 30 mg HS for sleep and anxiety.  He reports in the past that he took buspirone  and made him sleepy and he stopped it for that reason but wonders if it might help his sleep now.  It could also potentially help anxiety.  Option lithium augmentation.  Counseled patient regarding potential benefits, risks, and side effects of lithium to include potential risk of lithium affecting thyroid and renal function.  Discussed need for periodic lab monitoring to determine drug level and to assess for potential adverse effects.  Counseled patient regarding signs and symptoms of lithium toxicity and advised that they notify office immediately or seek urgent medical attention if experiencing these signs and symptoms.  Patient advised to contact office with any questions or concerns. He want try it lithium 300 mg for 1 week then 2 daily.  Recommend counseling with OCD therapist.     Disc his request for gun permit and concealed carry.    6 weeks  Lynder Parents, MD, DFAPA   Please see After Visit Summary for patient specific instructions.  Future Appointments  Date Time Provider Lily  01/21/2020 10:30 AM Cottle, Billey Co., MD [facility] None    No orders of the defined types were placed in this encounter.   -------------------------------

## 2019-12-11 ENCOUNTER — Other Ambulatory Visit: Payer: Self-pay | Admitting: Psychiatry

## 2019-12-11 ENCOUNTER — Telehealth: Payer: Self-pay | Admitting: Psychiatry

## 2019-12-11 DIAGNOSIS — F5105 Insomnia due to other mental disorder: Secondary | ICD-10-CM

## 2019-12-11 DIAGNOSIS — F411 Generalized anxiety disorder: Secondary | ICD-10-CM

## 2019-12-11 MED ORDER — BUSPIRONE HCL 30 MG PO TABS: ORAL_TABLET | ORAL | 1 refills | Status: DC

## 2019-12-11 NOTE — Telephone Encounter (Signed)
This interaction is mild and triamterene does not increase lithium levels markedly.  In addition I am giving him a low dose of lithium so the risk of problematic increase is further minimized.  He can proceed with the lithium as prescribed.

## 2019-12-11 NOTE — Telephone Encounter (Signed)
Lance Foley called because when he went to pick up Lance Foley prescription for Lithium the pharmacist told Lance Foley there was an interaction with his tiamterene and this increase his lithium levels.  They advised Lance Foley to check with the dr. To make sure the dr. Is aware of this interaction and still wants Lance Foley to take it.  Lance Foley would like a call back about this.

## 2019-12-11 NOTE — Telephone Encounter (Signed)
He is right I forgot to send that in.  Prescription has been sent.

## 2019-12-11 NOTE — Telephone Encounter (Signed)
Pt. Made aware. Pt states he was also supposed to have Buspar sent in to help with sleep>

## 2020-01-02 ENCOUNTER — Other Ambulatory Visit: Payer: Self-pay | Admitting: Psychiatry

## 2020-01-02 DIAGNOSIS — F411 Generalized anxiety disorder: Secondary | ICD-10-CM

## 2020-01-02 DIAGNOSIS — F422 Mixed obsessional thoughts and acts: Secondary | ICD-10-CM

## 2020-01-02 DIAGNOSIS — F331 Major depressive disorder, recurrent, moderate: Secondary | ICD-10-CM

## 2020-01-02 DIAGNOSIS — F4010 Social phobia, unspecified: Secondary | ICD-10-CM

## 2020-01-21 ENCOUNTER — Ambulatory Visit (INDEPENDENT_AMBULATORY_CARE_PROVIDER_SITE_OTHER): Payer: 59 | Admitting: Psychiatry

## 2020-01-21 ENCOUNTER — Encounter: Payer: Self-pay | Admitting: Psychiatry

## 2020-01-21 ENCOUNTER — Other Ambulatory Visit: Payer: Self-pay

## 2020-01-21 DIAGNOSIS — F4010 Social phobia, unspecified: Secondary | ICD-10-CM

## 2020-01-21 DIAGNOSIS — F411 Generalized anxiety disorder: Secondary | ICD-10-CM

## 2020-01-21 DIAGNOSIS — F5105 Insomnia due to other mental disorder: Secondary | ICD-10-CM

## 2020-01-21 DIAGNOSIS — F331 Major depressive disorder, recurrent, moderate: Secondary | ICD-10-CM

## 2020-01-21 DIAGNOSIS — F1010 Alcohol abuse, uncomplicated: Secondary | ICD-10-CM

## 2020-01-21 DIAGNOSIS — F422 Mixed obsessional thoughts and acts: Secondary | ICD-10-CM | POA: Diagnosis not present

## 2020-01-21 MED ORDER — CLOMIPRAMINE HCL 50 MG PO CAPS: 200.0000 mg | ORAL_CAPSULE | Freq: Every day | ORAL | 0 refills | Status: DC

## 2020-01-21 MED ORDER — ARIPIPRAZOLE 5 MG PO TABS: 5.0000 mg | ORAL_TABLET | Freq: Every day | ORAL | 0 refills | Status: DC

## 2020-01-21 MED ORDER — LORAZEPAM 0.5 MG PO TABS: 0.5000 mg | ORAL_TABLET | Freq: Three times a day (TID) | ORAL | 0 refills | Status: DC | PRN

## 2020-01-21 NOTE — Progress Notes (Signed)
Lance Foley [id number] 1986/01/31 34 y.o.  Subjective:   Patient ID:  Lance Foley is a 34 y.o. (DOB Mar 20, 1986) male.  Chief Complaint:  Chief Complaint  Patient presents with  . Anxiety  . Depression  . Sleeping Problem  . Follow-up    med change    HPI Lance Foley presents to the office today for follow-up of major depression, OCD, generalized anxiety disorder, social anxiety disorder, and alcohol abuse.  He was initially seen on June 26, 2019.  He had a high degree of anxiety over taking medications.  In fact he was fearful of medications.  Therefore he wanted to pursue GeneSight testing versus starting medications that were discussed with him.    On August 31 GeneSight testing was discussed with him in detail.  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   Based on these genetics it was suggested he might have a better response to this clomipramine versus standard SSRIs and that was recommended.  He wanted to research some more and discuss it at this appointment.  He was seen August 05, 2019.  Because of the genetic variations noted it was suggested that he try clomipramine first rather than go with a standard SSRI based on the genetics.  He wanted to defer starting that.  He called back October 8 stating he had tried Xanax for anxiety but it made him irritable and wanted to try an alternative so he was prescribed lorazepam 0.5 mg tablets 1-2 twice daily as needed.  He was cautioned not to take this with alcohol.  seen September 30, 2019.  On clomipramine 75 mg HS for 3 weeks.  Early SE resolved except tiredness at times but other times bursts of energy from nowhere. Poor after 3 hours of good sleep.  Restless sleep thereafter.   Is sleepy daytime at times.  No one else notices him as hyper.  Ordered serum clomipramine level at that time  which was 92 on a dose of 75 mg daily.  On December 1 he complained of not sleeping well with trazodone having nightmares.  That was switched to mirtazapine 7.5 mg nightly. In addition his low clomipramine level was discussed and it was recommended the increase to 100 mg daily and move it away from bedtime if possible to see if that was contributing to the insomnia.  He called back on December 14 wanting to increase clomipramine again because of not seeing benefit.  It is clearly not been long enough to see full benefit however based on the blood level it was reasonable to increase to 150 mg daily if he could tolerate it so he was given permission to do so.On 125 mg clomipramine for less than 3 weeks and can't tell much difference.  Doesn't feel it's helping as much as he'd like for depression and OCD but GAD is better with it.  seen November 12, 2019.  Mirtazapine was changed to hydroxyzine 25-50 nightly for sleep.  Abilify was added to potentiate the antidepressant.  Clomipramine was continued at 125 mg daily. He called pack complaining of poor sleep on January 19 and hydroxyzine was stopped and quetiapine 25 mg 1-2 nightly was prescribed for treatment resistant insomnia. Took Abilify 5 mg daily for 3 days and felt more depressed and stopped it.  No other SE.  Didn't see the point to taking it longer. Bad experience  with Seroquel with vivid dreams and didn't sleep really more.  Struck out in his sleep and fell out of bed.  Never acted out dreams before. Some difference in OCD with clomipramine but not better with anxiety and depression.  Less checking and leaves house quicker markedly.  Anxiety builds in afternoon with more irritable and negative ruminations and depressive thoughts.  Better in the morning. Sleep bad with 3 hours and toss and turn for hours thereafter.  Ativan 1 mg no longer helps.  No problems with sleep until clomipramine.   Don't feel tired daytime. Asks about Buspar which made him  sleepy in the past. Less worry and anxiety with the clomipramine but no improvement in OCD yet.  Struggles with motivation and depression and OCD and not sure which is worse.  Last seen February 2.  Because of symptoms ongoing as noted the following change was made: Increase clomipramine to 150 mg daily. Added lithium for potentiation.  OCD better with clomipramine and Buspar plus melatonin helps sleep.  Still depressed.   Broke up with GF of 3 years about 6 weeks ago and it's been hard.  Not accepted until a week ago.  Feels his lack of sexual interest was a problem.  This was a problem with her before the meds.  Had lost interest in her sexually before the meds.  Ativan 2 tablets every other week. 1 mg makes him sleepy.  PE recently was good.  Pt reports that mood is depressed moderate 6/10 and unchanged.   Anxious and describes anxiety as 4-5/10 which is better.Marland Kitchen Anxiety symptoms include: Excessive Worry, Obsessive Compulsive Symptoms:   as noted last appt,. Insomnia with 6-7 hours sleep/night, which is better.   Pt reports that appetite is good. Pt reports that energy is down significantly and no change and good. Concentration is down slightly. Suicidal thoughts:  denied by patient  No panic since here.  Work 4 days a week and drinks only 2-3 beers on off days maybe 6-7 beers. No change since last here.  Case and a half weekly.  Does not drive after alcohol.    Asks for prn relief med not everyday for severe anxiety.    Past Psychiatric Medication Trials: Zoloft SE, alprazolam irritable, lorazepam Trazodone and mirtazapine bad dreams and tried 3-4 nights. Quetiapine 25 bad dreams Wellbutrin rash Low dose Prozac for a year NR Lithium 600 NR [employer] lost response  Buspirone sleepy History of Lance Foley.  Not seen in 3-4 mos History of Lance Foley.   Review of Systems:  Review of Systems  Neurological: Negative for dizziness, tremors and weakness.  Psychiatric/Behavioral:  Positive for dysphoric mood. Negative for agitation, confusion, decreased concentration and suicidal ideas. The patient is nervous/anxious.     Medications: I have reviewed the patient's current medications.  Current Outpatient Medications  Medication Sig Dispense Refill  . busPIRone (BUSPAR) 30 MG tablet 1/3 tablet at night for 5 days, then 2/3 tablets at night for 5 days, then 1 tablet each night (Patient taking differently: Take 30 mg by mouth at bedtime. ) 30 tablet 1  . clomiPRAMINE (ANAFRANIL) 50 MG capsule Take 4 capsules (200 mg total) by mouth daily. 270 capsule 0  . LORazepam (ATIVAN) 0.5 MG tablet Take 1-2 tablets (0.5-1 mg total) by mouth every 8 (eight) hours as needed for anxiety. 15 tablet 0  . Melatonin 10 MG TABS Take 20 mg by mouth at bedtime.    . Multiple Vitamin (ONE-A-DAY MENS PO) Take by  mouth.    . nicotine (NICODERM CQ) 21 mg/24hr patch Place 1 patch (21 mg total) onto the skin daily. 28 patch 1  . triamterene-hydrochlorothiazide (DYAZIDE) 37.5-25 MG per capsule   4  . ARIPiprazole (ABILIFY) 5 MG tablet Take 1 tablet (5 mg total) by mouth daily. 90 tablet 0   No current facility-administered medications for this visit.    Medication Side Effects" : insomnia, lower sex drive and weight gain, mild sweating, dry mouth  Allergies:  Allergies  Allergen Reactions  . Chantix [Varenicline] Other (See Comments)    intolerant  . Oxycodone-Acetaminophen     REACTION: SEVERE ITCHING  . Penicillins     REACTION: RASH (any "cillins" meds  . Wellbutrin [Bupropion] Hives    Unsure if this was the cause     Past Medical History:  Diagnosis Date  . 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 Meniere syndrome   . Substance abuse ([place])    alcohol abuse, active    Family History  Problem Relation Age of Onset  . Depression Mother   . Obesity Mother   . Healthy Sister     Social History   Socioeconomic History  . Marital status: Single    Spouse name: Not on  file  . Number of children: Not on file  . Years of education: Not on file  . Highest education level: Not on file  Occupational History  . Occupation: Furniture conservator/restorer  Tobacco Use  . Smoking status: Current Every Day Smoker    Packs/day: 1.00    Years: 15.00    Pack years: 15.00    Types: Cigarettes  . Smokeless tobacco: Never Used  Substance and Sexual Activity  . Alcohol use: Yes    Alcohol/week: 168.0 standard drinks    Types: 168 Cans of beer per week    Comment: drinks a case of beer daily  . Drug use: Not Currently  . Sexual activity: Not Currently    Birth control/protection: Condom  Other Topics Concern  . Not on file  Social History Narrative  . Not on file   Social Determinants of Health   Financial Resource Strain:   . Difficulty of Paying Living Expenses:   Food Insecurity:   . Worried About Charity fundraiser in the Last Year:   . Arboriculturist in the Last Year:   Transportation Needs:   . Film/video editor (Medical):   Marland Kitchen Lack of Transportation (Non-Medical):   Physical Activity:   . Days of Exercise per Week:   . Minutes of Exercise per Session:   Stress:   . Feeling of Stress :   Social Connections:   . Frequency of Communication with Friends and Family:   . Frequency of Social Gatherings with Friends and Family:   . Attends Religious Services:   . Active Member of Clubs or Organizations:   . Attends Archivist Meetings:   Marland Kitchen Marital Status:   Intimate Partner Violence:   . Fear of Current or Ex-Partner:   . Emotionally Abused:   Marland Kitchen Physically Abused:   . Sexually Abused:     Past Medical History, Surgical history, Social history, and Family history were reviewed and updated as appropriate.   Please see review of systems for further details on the patient's review from today.   Objective:   Physical Exam:  There were no vitals taken for this visit.  Physical Exam Constitutional:  General: He is not in acute distress.     Appearance: He is well-developed.  Musculoskeletal:        General: No deformity.  Neurological:     Mental Status: He is alert and oriented to person, place, and time.     Coordination: Coordination normal.  Psychiatric:        Attention and Perception: Attention and perception normal. He does not perceive auditory or visual hallucinations.        Mood and Affect: Mood is anxious and depressed. Affect is blunt. Affect is not labile, angry or inappropriate.        Speech: Speech normal.        Behavior: Behavior normal.        Thought Content: Thought content normal. Thought content does not include homicidal or suicidal ideation. Thought content does not include homicidal or suicidal plan.        Cognition and Memory: Cognition and memory normal.        Judgment: Judgment normal.     Comments: Insight intact. No delusions.  OCD.  Somewhat ruminative.      Lab Review:     Component Value Date/Time   NA 138 09/16/2019 0900   K 4.1 09/16/2019 0900   CL 98 09/16/2019 0900   CO2 31 09/16/2019 0900   GLUCOSE 96 09/16/2019 0900   BUN 21 09/16/2019 0900   CREATININE 0.89 09/16/2019 0900   CALCIUM 9.8 09/16/2019 0900   PROT 6.8 09/16/2019 0900   ALBUMIN 4.6 09/16/2019 0900   AST 19 09/16/2019 0900   ALT 29 09/16/2019 0900   ALKPHOS 102 09/16/2019 0900   BILITOT 0.4 09/16/2019 0900   GFRNONAA 98 01/30/2008 1430   GFRAA 119 01/30/2008 1430       Component Value Date/Time   WBC 15.7 (H) 09/16/2019 0900   RBC 5.10 09/16/2019 0900   HGB 16.5 09/16/2019 0900   HCT 48.1 09/16/2019 0900   PLT 244.0 09/16/2019 0900   MCV 94.3 09/16/2019 0900   MCHC 34.2 09/16/2019 0900   RDW 13.2 09/16/2019 0900   LYMPHSABS 2.5 09/16/2019 0900   MONOABS 1.0 09/16/2019 0900   EOSABS 0.2 09/16/2019 0900   BASOSABS 0.1 09/16/2019 0900    No results found for: POCLITH, LITHIUM   No results found for: PHENYTOIN, PHENOBARB, VALPROATE, CBMZ   September 30, 2019 clomipramine  was 92 on a dose of  75 mg daily.  .res Assessment: Plan:    Ivor was seen today for anxiety, depression, sleeping problem and follow-up.  Diagnoses and all orders for this visit:  Major depressive disorder, recurrent episode, moderate (HCC) -     Clomipramine and metabolite, serum -     clomiPRAMINE (ANAFRANIL) 50 MG capsule; Take 4 capsules (200 mg total) by mouth daily. -     ARIPiprazole (ABILIFY) 5 MG tablet; Take 1 tablet (5 mg total) by mouth daily.  Mixed obsessional thoughts and acts -     Clomipramine and metabolite, serum -     clomiPRAMINE (ANAFRANIL) 50 MG capsule; Take 4 capsules (200 mg total) by mouth daily. -     ARIPiprazole (ABILIFY) 5 MG tablet; Take 1 tablet (5 mg total) by mouth daily. -     LORazepam (ATIVAN) 0.5 MG tablet; Take 1-2 tablets (0.5-1 mg total) by mouth every 8 (eight) hours as needed for anxiety.  Generalized anxiety disorder -     clomiPRAMINE (ANAFRANIL) 50 MG capsule; Take 4 capsules (200 mg total) by mouth daily.  Insomnia due to mental condition  Social anxiety disorder -     clomiPRAMINE (ANAFRANIL) 50 MG capsule; Take 4 capsules (200 mg total) by mouth daily.  Alcohol abuse    Discussed his ongoing problems with depression, anxiety, OCD, and treatment resistant insomnia as well.  The OCD is partially responsive to the clomipramine.  He is seeing improvement in the OCD.  However the anxiety depression and insomnia persist.  As noted he has had side effects with various things of that have been tried.  He can be somewhat impatient such as was the case with only 3 days of Abilify.  That is in an adequate trial  Reviewed discussion Genesight.    Discussed higher than usual risk of problems with SSRIs due to the GeneSight genetics noted.  Discussed side effects of clomipramine.  He is having some side effects as noted above.  However increasing the dose could further reduce his anxiety and depression as well as OCD. Clomipramine level 92 at 75 mg .  Should be  more solid in normal range at 125 mg with potential to increase to 150 mg.  Some insomnia with it. Taking it in AM now   Increase clomipramine to 200 mg daily.   Then check level again.  No Ativan with alcohol.  Ok a few prn severe panic/anxiety #12/month.  Again encouraged him to further moderate his alcohol usage.  He is using alcohol to self medicate anxiety.  Option Abilify potentiation to speed recovery.  Discussed potential metabolic side effects associated with atypical antipsychotics, as well as potential risk for movement side effects. Advised pt to contact office if movement side effects occur.  Option retry bc he didn't give it a chance.   Yes Abilify 5 mg daily.  Buspirone 30 mg HS for sleep and anxiety.  He reports in the past that he took buspirone and made him sleepy and he stopped it for that reason but wonders if it might help his sleep now.  It could also potentially help anxiety.  Wean lithium DT NR.    Recommend counseling with OCD therapist.     Disc his request for gun permit and concealed carry.    4-6 weeks  Lynder Parents, MD, DFAPA   Please see After Visit Summary for patient specific instructions.  No future appointments.  Orders Placed This Encounter  Procedures  . Clomipramine and metabolite, serum    -------------------------------

## 2020-01-21 NOTE — Patient Instructions (Signed)
Since lithium has not been helpful for depression and off of it.  Drop to 1 daily for 1 week and then stop it

## 2020-01-27 ENCOUNTER — Telehealth: Payer: Self-pay | Admitting: Psychiatry

## 2020-01-27 NOTE — Telephone Encounter (Signed)
Pt called reported  Abilify is making him sleepy. Is it ok to start taking at night? Call [phone]

## 2020-01-27 NOTE — Telephone Encounter (Signed)
Left patient message that it is okay to take his Abilify at hs.

## 2020-02-01 ENCOUNTER — Other Ambulatory Visit: Payer: Self-pay | Admitting: Psychiatry

## 2020-02-01 DIAGNOSIS — F5105 Insomnia due to other mental disorder: Secondary | ICD-10-CM

## 2020-02-01 DIAGNOSIS — F411 Generalized anxiety disorder: Secondary | ICD-10-CM

## 2020-02-17 ENCOUNTER — Telehealth: Payer: Self-pay | Admitting: Family Medicine

## 2020-02-17 DIAGNOSIS — E349 Endocrine disorder, unspecified: Secondary | ICD-10-CM

## 2020-02-17 NOTE — Telephone Encounter (Signed)
I need a diagnosis code (symptom) to assign a referral to.  ? Decreased semen or abnormal appearance or ejaculatory issues or worries about fertility (or other)  Let me know and I will go forward

## 2020-02-17 NOTE — Telephone Encounter (Signed)
Pt would to be referred to somewhere to have his sperm tested.  Pt states that this is for personal reasons - "just to know"  Please advise, thanks.

## 2020-02-17 NOTE — Telephone Encounter (Signed)
Left VM requesting pt to call the office back 

## 2020-02-17 NOTE — Telephone Encounter (Signed)
Pt said he really just "want's to know" but he said that he guess it would fall under fertility questions, for in the "future if he does want to have kids" given his testosterone deficiency

## 2020-02-17 NOTE — Telephone Encounter (Signed)
I placed a urology referral and will send to [facility]

## 2020-02-24 ENCOUNTER — Encounter: Payer: Self-pay | Admitting: Psychiatry

## 2020-02-24 ENCOUNTER — Other Ambulatory Visit: Payer: Self-pay

## 2020-02-24 ENCOUNTER — Ambulatory Visit (INDEPENDENT_AMBULATORY_CARE_PROVIDER_SITE_OTHER): Payer: 59 | Admitting: Psychiatry

## 2020-02-24 ENCOUNTER — Other Ambulatory Visit: Payer: Self-pay | Admitting: Psychiatry

## 2020-02-24 DIAGNOSIS — F4010 Social phobia, unspecified: Secondary | ICD-10-CM

## 2020-02-24 DIAGNOSIS — F331 Major depressive disorder, recurrent, moderate: Secondary | ICD-10-CM | POA: Diagnosis not present

## 2020-02-24 DIAGNOSIS — F422 Mixed obsessional thoughts and acts: Secondary | ICD-10-CM

## 2020-02-24 DIAGNOSIS — F5105 Insomnia due to other mental disorder: Secondary | ICD-10-CM | POA: Diagnosis not present

## 2020-02-24 DIAGNOSIS — F411 Generalized anxiety disorder: Secondary | ICD-10-CM | POA: Diagnosis not present

## 2020-02-24 MED ORDER — LORAZEPAM 0.5 MG PO TABS: 0.5000 mg | ORAL_TABLET | Freq: Three times a day (TID) | ORAL | 1 refills | Status: DC | PRN

## 2020-02-24 NOTE — Progress Notes (Signed)
DOMER LOFFREDO [id number] [birth year] 34 y.o.  Subjective:   Patient ID:  Lance Foley is a 34 y.o. (DOB [birth year]) male.  Chief Complaint:  Chief Complaint  Patient presents with  . Depression  . Anxiety  . Follow-up    OCD  . Sleeping Problem    HPI Lance Foley presents to the office today for follow-up of major depression, OCD, generalized anxiety disorder, social anxiety disorder, and alcohol abuse.  He was initially seen on June 26, 2019.  He had a high degree of anxiety over taking medications.  In fact he was fearful of medications.  Therefore he wanted to pursue GeneSight testing versus starting medications that were discussed with him.    On August 31 GeneSight testing was discussed with him in detail.  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   Based on these genetics it was suggested he might have a better response to this clomipramine versus standard SSRIs and that was recommended.  He wanted to research some more and discuss it at this appointment.  He was seen August 05, 2019.  Because of the genetic variations noted it was suggested that he try clomipramine first rather than go with a standard SSRI based on the genetics.  He wanted to defer starting that.  He called back October 8 stating he had tried Xanax for anxiety but it made him irritable and wanted to try an alternative so he was prescribed lorazepam 0.5 mg tablets 1-2 twice daily as needed.  He was cautioned not to take this with alcohol.  seen September 30, 2019.  On clomipramine 75 mg HS for 3 weeks.  Early SE resolved except tiredness at times but other times bursts of energy from nowhere. Poor after 3 hours of good sleep.  Restless sleep thereafter.   Is sleepy daytime at times.  No one else notices him as hyper.  Ordered serum clomipramine level at that time which was  92 on a dose of 75 mg daily.  On December 1 he complained of not sleeping well with trazodone having nightmares.  That was switched to mirtazapine 7.5 mg nightly. In addition his low clomipramine level was discussed and it was recommended the increase to 100 mg daily and move it away from bedtime if possible to see if that was contributing to the insomnia.  He called back on December 14 wanting to increase clomipramine again because of not seeing benefit.  It is clearly not been long enough to see full benefit however based on the blood level it was reasonable to increase to 150 mg daily if he could tolerate it so he was given permission to do so.On 125 mg clomipramine for less than 3 weeks and can't tell much difference.  Doesn't feel it's helping as much as he'd like for depression and OCD but GAD is better with it.  seen November 12, 2019.  Mirtazapine was changed to hydroxyzine 25-50 nightly for sleep.  Abilify was added to potentiate the antidepressant.  Clomipramine was continued at 125 mg daily. He called pack complaining of poor sleep on January 19 and hydroxyzine was stopped and quetiapine 25 mg 1-2 nightly was prescribed for treatment resistant insomnia. Took Abilify 5 mg daily for 3 days and felt more depressed and stopped it.  No other SE.  Didn't see the point to taking it longer. Bad experience with  Seroquel with vivid dreams and didn't sleep really more.  Struck out in his sleep and fell out of bed.  Never acted out dreams before. Some difference in OCD with clomipramine but not better with anxiety and depression.  Less checking and leaves house quicker markedly.  Anxiety builds in afternoon with more irritable and negative ruminations and depressive thoughts.  Better in the morning. Sleep bad with 3 hours and toss and turn for hours thereafter.  Ativan 1 mg no longer helps.  No problems with sleep until clomipramine.   Don't feel tired daytime. Asks about Buspar which made him sleepy in  the past. Less worry and anxiety with the clomipramine but no improvement in OCD yet.  Struggles with motivation and depression and OCD and not sure which is worse.  seen February 2.  Because of symptoms ongoing as noted the following change was made: Increase clomipramine to 150 mg daily. Added lithium for potentiation.  Last seen January 21, 2020.  The following was noted: OCD better but not resolved with clomipramine and Buspar plus melatonin helps sleep.  Still depressed.  Anxiety and depressive symptoms were rare related is moderate. Broke up with GF of 3 years about 6 weeks ago and it's been hard.  Not accepted until a week ago.  Feels his lack of sexual interest was a problem.  This was a problem with her before the meds.  Had lost interest in her sexually before the meds. The following plan was made:Discussed higher than usual risk of problems with SSRIs due to the GeneSight genetics noted.  Discussed side effects of clomipramine.  He is having some side effects as noted above.  However increasing the dose could further reduce his anxiety and depression as well as OCD. Clomipramine level 92 at 75 mg .  Should be more solid in normal range at 125 mg with potential to increase to 150 mg.  Some insomnia with it. Taking it in AM now   Increase clomipramine to 200 mg daily.   Then check level again. No Ativan with alcohol.  Ok a few prn severe panic/anxiety #12/month.  Again encouraged him to further moderate his alcohol usage.  He is using alcohol to self medicate anxiety. Option Abilify potentiation to speed recovery.   Option retry bc he didn't give it a chance.   Yes Abilify 5 mg daily. Buspirone 30 mg HS for sleep and anxiety.  He reports in the past that he took buspirone and made him sleepy and he stopped it for that reason but wonders if it might help his sleep now.  It could also potentially help anxiety. wean lithium DT NR.    As of appointment February 24, 2020 the following is  reported: depression and anxiety are both better.  Starts day feeling good and positive with energy but around noon gets more tired and negative and depressed as day progresses.  Pattern even before the meds.  Ativan 2 tablets every other week. 1 mg makes him sleepy.  PE recently was good.  Pt reports that mood is worse later in the day as noted  depressed moderate 6/10 and unchanged.   Anxious and describes anxiety as 4-5/10 which is better.Marland Kitchen Anxiety symptoms include: Excessive Worry, Obsessive Compulsive Symptoms:   as noted last appt,. Startled easily is not new but maybe worse.  Insomnia not as good on Abilify with 4-5 hours sleep/night, which is better.   Vivid dreams and can thrash.  No NM since the meds.  Did have before the meds.  Pt reports that appetite is good. Pt reports that energy is down significantly and no change and good. Concentration is down slightly. Suicidal thoughts:  denied by patient.    No panic since here. OCD is better.  Would take 10 min to leave house and now can leave in 2 mins with much less anxiety and easier to let it go. He had worsening insomnia with clomipramine  HS.    Work 4 days a week and drinks only 2-3 beers on off days maybe 6-7 beers. No change since last here.  Case and a half weekly.  Does not drive after alcohol.    Asks for prn relief med not everyday for severe anxiety.    Past Psychiatric Medication Trials: Zoloft SE, Low dose Prozac for a year NR alprazolam irritable, lorazepam, Trazodone and mirtazapine bad dreams and tried 3-4 nights. Quetiapine 25 bad dreams, Unisom lost response  Wellbutrin rash Abilify some benefit but insomnia Lithium 600 NR Buspirone sleepy History of Wilber Bihari.  Not seen in 3-4 mos History of Andy Mitchum.   Review of Systems:  Review of Systems  Neurological: Positive for dizziness. Negative for tremors and weakness.  Psychiatric/Behavioral: Positive for dysphoric mood. Negative for agitation, confusion,  decreased concentration and suicidal ideas. The patient is nervous/anxious.     Medications: I have reviewed the patient's current medications.  Current Outpatient Medications  Medication Sig Dispense Refill  . ARIPiprazole (ABILIFY) 5 MG tablet Take 1 tablet (5 mg total) by mouth daily. 90 tablet 0  . busPIRone (BUSPAR) 30 MG tablet Take 1 tablet (30 mg total) by mouth at bedtime. 90 tablet 0  . clomiPRAMINE (ANAFRANIL) 50 MG capsule Take 4 capsules (200 mg total) by mouth daily. (Patient taking differently: Take 200 mg by mouth daily. He's taking it in AM) 270 capsule 0  . LORazepam (ATIVAN) 0.5 MG tablet Take 1-2 tablets (0.5-1 mg total) by mouth every 8 (eight) hours as needed for anxiety. 15 tablet 1  . Melatonin 10 MG TABS Take 20 mg by mouth at bedtime.    . Multiple Vitamin (ONE-A-DAY MENS PO) Take by mouth.    . nicotine (NICODERM CQ) 21 mg/24hr patch Place 1 patch (21 mg total) onto the skin daily. 28 patch 1  . triamterene-hydrochlorothiazide (DYAZIDE) 37.5-25 MG per capsule   4   No current facility-administered medications for this visit.    Medication Side Effects" : insomnia, lower sex drive and weight gain, mild sweating, dry mouth  Allergies:  Allergies  Allergen Reactions  . Chantix [Varenicline] Other (See Comments)    intolerant  . Oxycodone-Acetaminophen     REACTION: SEVERE ITCHING  . Penicillins     REACTION: RASH (any "cillins" meds  . Wellbutrin [Bupropion] Hives    Unsure if this was the cause     Past Medical History:  Diagnosis Date  . 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 Meniere syndrome   . Substance abuse ([place])    alcohol abuse, active    Family History  Problem Relation Age of Onset  . Depression Mother   . Obesity Mother   . Healthy Sister     Social History   Socioeconomic History  . Marital status: Single    Spouse name: Not on file  . Number of children: Not on file  . Years of education: Not on file  . Highest  education level: Not on file  Occupational History  . Occupation: Furniture conservator/restorer  Tobacco Use  . Smoking status: Current Every Day Smoker    Packs/day: 1.00    Years: 15.00    Pack years: 15.00    Types: Cigarettes  . Smokeless tobacco: Never Used  Substance and Sexual Activity  . Alcohol use: Yes    Alcohol/week: 168.0 standard drinks    Types: 168 Cans of beer per week    Comment: drinks a case of beer daily  . Drug use: Not Currently  . Sexual activity: Not Currently    Birth control/protection: Condom  Other Topics Concern  . Not on file  Social History Narrative  . Not on file   Social Determinants of Health   Financial Resource Strain:   . Difficulty of Paying Living Expenses:   Food Insecurity:   . Worried About Charity fundraiser in the Last Year:   . Arboriculturist in the Last Year:   Transportation Needs:   . Film/video editor (Medical):   Marland Kitchen Lack of Transportation (Non-Medical):   Physical Activity:   . Days of Exercise per Week:   . Minutes of Exercise per Session:   Stress:   . Feeling of Stress :   Social Connections:   . Frequency of Communication with Friends and Family:   . Frequency of Social Gatherings with Friends and Family:   . Attends Religious Services:   . Active Member of Clubs or Organizations:   . Attends Archivist Meetings:   Marland Kitchen Marital Status:   Intimate Partner Violence:   . Fear of Current or Ex-Partner:   . Emotionally Abused:   Marland Kitchen Physically Abused:   . Sexually Abused:     Past Medical History, Surgical history, Social history, and Family history were reviewed and updated as appropriate.   Please see review of systems for further details on the patient's review from today.   Objective:   Physical Exam:  There were no vitals taken for this visit.  Physical Exam Constitutional:      General: He is not in acute distress.    Appearance: He is well-developed.  Musculoskeletal:        General: No deformity.   Neurological:     Mental Status: He is alert and oriented to person, place, and time.     Coordination: Coordination normal.  Psychiatric:        Attention and Perception: Attention and perception normal. He does not perceive auditory or visual hallucinations.        Mood and Affect: Mood is anxious and depressed. Affect is blunt. Affect is not labile, angry or inappropriate.        Speech: Speech normal.        Behavior: Behavior normal.        Thought Content: Thought content normal. Thought content does not include homicidal or suicidal ideation. Thought content does not include homicidal or suicidal plan.        Cognition and Memory: Cognition and memory normal.        Judgment: Judgment normal.     Comments: Insight intact. No delusions.  OCD.  Not ruminative.      Lab Review:     Component Value Date/Time   NA 138 09/16/2019 0900   K 4.1 09/16/2019 0900   CL 98 09/16/2019 0900   CO2 31 09/16/2019 0900   GLUCOSE 96 09/16/2019 0900   BUN 21 09/16/2019 0900   CREATININE 0.89 09/16/2019 0900  CALCIUM 9.8 09/16/2019 0900   PROT 6.8 09/16/2019 0900   ALBUMIN 4.6 09/16/2019 0900   AST 19 09/16/2019 0900   ALT 29 09/16/2019 0900   ALKPHOS 102 09/16/2019 0900   BILITOT 0.4 09/16/2019 0900   GFRNONAA 98 01/30/2008 1430   GFRAA 119 01/30/2008 1430       Component Value Date/Time   WBC 15.7 (H) 09/16/2019 0900   RBC 5.10 09/16/2019 0900   HGB 16.5 09/16/2019 0900   HCT 48.1 09/16/2019 0900   PLT 244.0 09/16/2019 0900   MCV 94.3 09/16/2019 0900   MCHC 34.2 09/16/2019 0900   RDW 13.2 09/16/2019 0900   LYMPHSABS 2.5 09/16/2019 0900   MONOABS 1.0 09/16/2019 0900   EOSABS 0.2 09/16/2019 0900   BASOSABS 0.1 09/16/2019 0900    No results found for: POCLITH, LITHIUM   No results found for: PHENYTOIN, PHENOBARB, VALPROATE, CBMZ   September 30, 2019 clomipramine  was 92 on a dose of 75 mg daily.   Genesight testing  .res Assessment: Plan:    Demontay was seen today  for depression, anxiety, follow-up and sleeping problem.  Diagnoses and all orders for this visit:  Major depressive disorder, recurrent episode, moderate (HCC)  Mixed obsessional thoughts and acts -     LORazepam (ATIVAN) 0.5 MG tablet; Take 1-2 tablets (0.5-1 mg total) by mouth every 8 (eight) hours as needed for anxiety.  Generalized anxiety disorder  Insomnia due to mental condition  Social anxiety disorder    Discussed his ongoing problems with depression, anxiety, OCD, and treatment resistant insomnia as well.  The OCD is partially responsive to the clomipramine.  He is seeing improvement in the OCD.  However the anxiety depression and insomnia persist but are some better.  As noted he has had side effects with various things of that have been tried.  .  Reverse diurnal pattern of sx.  Reviewed discussion Genesight.    Discussed higher than usual risk of problems with SSRIs due to the GeneSight genetics noted.  Discussed side effects of clomipramine.  He is having some side effects as noted above.  However increasing the dose could further reduce his anxiety and depression as well as OCD. Clomipramine level 92 at 75 mg .  Should be more solid in normal range at 125 mg with potential to increase to 150 mg.  Some insomnia with it. Taking it in AM now   continue clomipramine to 200 mg daily but split dose.   check level again.  He hasn't done so yet.  Residual sx of depression and anxiety.    No Ativan with alcohol.  Ok a few prn severe panic/anxiety #12/month.  Again encouraged him to further moderate his alcohol usage.  He is using alcohol to self medicate anxiety.  Option Abilify potentiation to speed recovery.  Discussed potential metabolic side effects associated with atypical antipsychotics, as well as potential risk for movement side effects. Advised pt to contact office if movement side effects occur. Option increase dose to further improve. Increase Abilify to 10 mg  daily.  Buspirone 30 mg HS for sleep and anxiety.  He reports in the past that he took buspirone and made him sleepy and he stopped it for that reason but wonders if it might help his sleep now.  It could also potentially help anxiety.  No problem off the lithium.   Insomnia is a chronic problem.  Concerns about using BZ bc of alcohol.  Failed several sleep meds.  Recommend counseling  with OCD therapist.     Disc his request for gun permit and concealed carry.    4-6 weeks  Lynder Parents, MD, DFAPA   Please see After Visit Summary for patient specific instructions.  No future appointments.  No orders of the defined types were placed in this encounter.   -------------------------------

## 2020-02-24 NOTE — Patient Instructions (Addendum)
Increase aripiprazole to 10 mg daily for depression and anxiety  Split clomipramine at 100 mg in AM and PM to see if energy is better  Get blood test

## 2020-02-26 LAB — CLOMIPRAMINE
Clomipramine.: 294 mcg/L — ABNORMAL HIGH (ref 50–250)
Desemethylclomipramine: 225 mcg/L (ref 150–350)
Tot Clomipramine+Desmethylclomipramine: 519 mcg/L (ref 200–600)

## 2020-02-27 ENCOUNTER — Encounter: Payer: Self-pay | Admitting: Psychiatry

## 2020-03-04 ENCOUNTER — Ambulatory Visit: Payer: Managed Care, Other (non HMO) | Admitting: Family Medicine

## 2020-03-04 ENCOUNTER — Other Ambulatory Visit: Payer: Self-pay

## 2020-03-04 ENCOUNTER — Encounter: Payer: Self-pay | Admitting: Family Medicine

## 2020-03-04 VITALS — BP 135/82 | HR 113 | Temp 98.0°F | Wt 224.0 lb

## 2020-03-04 DIAGNOSIS — E669 Obesity, unspecified: Secondary | ICD-10-CM | POA: Diagnosis not present

## 2020-03-04 DIAGNOSIS — R7309 Other abnormal glucose: Secondary | ICD-10-CM | POA: Diagnosis not present

## 2020-03-04 DIAGNOSIS — F418 Other specified anxiety disorders: Secondary | ICD-10-CM

## 2020-03-04 DIAGNOSIS — K429 Umbilical hernia without obstruction or gangrene: Secondary | ICD-10-CM

## 2020-03-04 DIAGNOSIS — F17200 Nicotine dependence, unspecified, uncomplicated: Secondary | ICD-10-CM | POA: Diagnosis not present

## 2020-03-04 NOTE — Patient Instructions (Addendum)
To prevent diabetes and high blood pressure  Try to get most of your carbohydrates from produce (with the exception of white potatoes)  Eat less bread/pasta/rice/snack foods/cereals/sweets and other items from the middle of the grocery store (processed carbs)   I think you have an umbilical hernia  We can watch it  If you have pain / especially when lifting or straining let me know  If it get bigger let me know  We can refer you to a general surgeon at any time   Weight loss would help also

## 2020-03-04 NOTE — Assessment & Plan Note (Signed)
Continues psychiatric f/u with Dr Clovis Pu Doing better on current medications  Causing wt gain however  Enc exercise and good self care

## 2020-03-04 NOTE — Assessment & Plan Note (Signed)
Pt takes abilify and clomipramine for OCD/mood disorder-causing some weight gain and pos hyperglycemia Disc imp of lifestyle change to combat this with low glycemic diet and exercise

## 2020-03-04 NOTE — Assessment & Plan Note (Signed)
At home-pt had elevated fasting glucose one day at 105 Has gained significant weight on current psychiatric medicine Warned that abilify may affect glucose Disc prev DM with wt loss/healthy diet and exercise  Will observe

## 2020-03-04 NOTE — Assessment & Plan Note (Signed)
Disc in detail risks of smoking and possible outcomes including copd, vascular/ heart disease, cancer , respiratory and sinus infections  Pt voices understanding He is not ready to quit

## 2020-03-04 NOTE — Assessment & Plan Note (Signed)
Unsure how long he has had this- weight gain in abdomen may make more prominent  Palpable while standing  Reducible and no pain or signs of incarceration  Disc s/s to watch for (enlargement/ pain or worsening with straining)  Urged to be cautious with straining/lifting  Would consider surg ref if worse or symptomatic For now will obs  Recommend wt loss as well
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
## 2020-03-13 ENCOUNTER — Telehealth: Payer: Self-pay

## 2020-03-13 NOTE — Telephone Encounter (Signed)
02-17-20 Urology Referral closed.  Per [facility], pt declined to make appt.  [place]

## 2020-03-16 ENCOUNTER — Other Ambulatory Visit: Payer: Self-pay | Admitting: Psychiatry

## 2020-03-16 DIAGNOSIS — F331 Major depressive disorder, recurrent, moderate: Secondary | ICD-10-CM

## 2020-03-16 DIAGNOSIS — F422 Mixed obsessional thoughts and acts: Secondary | ICD-10-CM

## 2020-03-17 ENCOUNTER — Other Ambulatory Visit: Payer: Self-pay

## 2020-03-17 MED ORDER — ARIPIPRAZOLE 5 MG PO TABS: 5.0000 mg | ORAL_TABLET | Freq: Every day | ORAL | 1 refills | Status: DC

## 2020-03-18 ENCOUNTER — Other Ambulatory Visit: Payer: Self-pay

## 2020-03-18 ENCOUNTER — Telehealth: Payer: Self-pay | Admitting: Psychiatry

## 2020-03-18 MED ORDER — ARIPIPRAZOLE 10 MG PO TABS: 10.0000 mg | ORAL_TABLET | Freq: Every day | ORAL | 1 refills | Status: DC

## 2020-03-18 NOTE — Telephone Encounter (Signed)
Pt said that pharmacy will not fill his rx for Abilify. They told him that he needs a new rx for 10mg  not 5mg . Please send to [facility] on [address]. Pt will completely out by tomorrow.

## 2020-03-18 NOTE — Telephone Encounter (Signed)
Submitted updated Rx for Abilify 10 mg to [facility]

## 2020-03-30 ENCOUNTER — Telehealth: Payer: Self-pay | Admitting: Psychiatry

## 2020-03-30 NOTE — Telephone Encounter (Signed)
Stop Abilify since it's not helpful and might be causing SE.  Put him on cancellation list.

## 2020-03-30 NOTE — Telephone Encounter (Signed)
Pt has been real depressed for the last 3 weeks and his anxiety has been high too. Pt thinks its from the Abilify that was increased. Pt wants to know if he can stop taking it or get something else called in. Please call.

## 2020-04-07 ENCOUNTER — Ambulatory Visit: Payer: 59 | Admitting: Psychiatry

## 2020-04-09 ENCOUNTER — Other Ambulatory Visit: Payer: Self-pay | Admitting: Psychiatry

## 2020-04-13 ENCOUNTER — Telehealth: Payer: Self-pay | Admitting: Psychiatry

## 2020-04-13 ENCOUNTER — Other Ambulatory Visit: Payer: Self-pay | Admitting: Psychiatry

## 2020-04-13 DIAGNOSIS — F422 Mixed obsessional thoughts and acts: Secondary | ICD-10-CM

## 2020-04-13 MED ORDER — LORAZEPAM 1 MG PO TABS: 1.0000 mg | ORAL_TABLET | Freq: Three times a day (TID) | ORAL | 0 refills | Status: DC | PRN

## 2020-04-13 NOTE — Telephone Encounter (Signed)
Sent prescription for lorazepam 1 mg instead of 0.5 mg tablets.  Please let him know.

## 2020-04-13 NOTE — Telephone Encounter (Signed)
Lance Foley called to ask if you would increase his lorazepam dosage.  He doesn't feel that it is working as is.  [facility] [address] in [place],  [place] 6/19

## 2020-04-23 ENCOUNTER — Ambulatory Visit (INDEPENDENT_AMBULATORY_CARE_PROVIDER_SITE_OTHER): Payer: 59 | Admitting: Psychiatry

## 2020-04-23 ENCOUNTER — Other Ambulatory Visit: Payer: Self-pay

## 2020-04-23 ENCOUNTER — Encounter: Payer: Self-pay | Admitting: Psychiatry

## 2020-04-23 DIAGNOSIS — F5105 Insomnia due to other mental disorder: Secondary | ICD-10-CM

## 2020-04-23 DIAGNOSIS — F422 Mixed obsessional thoughts and acts: Secondary | ICD-10-CM | POA: Diagnosis not present

## 2020-04-23 DIAGNOSIS — F331 Major depressive disorder, recurrent, moderate: Secondary | ICD-10-CM | POA: Diagnosis not present

## 2020-04-23 DIAGNOSIS — F4010 Social phobia, unspecified: Secondary | ICD-10-CM | POA: Diagnosis not present

## 2020-04-23 DIAGNOSIS — F411 Generalized anxiety disorder: Secondary | ICD-10-CM | POA: Diagnosis not present

## 2020-04-23 MED ORDER — BUSPIRONE HCL 30 MG PO TABS: 30.0000 mg | ORAL_TABLET | Freq: Every evening | ORAL | 0 refills | Status: DC

## 2020-04-23 MED ORDER — LORAZEPAM 1 MG PO TABS: 1.0000 mg | ORAL_TABLET | Freq: Three times a day (TID) | ORAL | 0 refills | Status: DC | PRN

## 2020-04-23 MED ORDER — CLOMIPRAMINE HCL 50 MG PO CAPS
ORAL_CAPSULE | ORAL | 0 refills | Status: DC
Start: 2020-04-23 — End: 2020-07-16

## 2020-04-23 MED ORDER — RISPERIDONE 1 MG PO TABS: ORAL_TABLET | ORAL | 1 refills | Status: DC

## 2020-04-23 NOTE — Patient Instructions (Signed)
Increase clomipramine to 5 capsules daily  Add risperidone 1 mg at night for 1 week and if no improvement increase to 2 tablets each night.  If no improvement with risperidone 2 mg after 2 weeks call and will increase to 3 mg.

## 2020-04-23 NOTE — Progress Notes (Signed)
Lance Foley [id number] 08/02/1986 34 y.o.  Subjective:   Patient ID:  Lance Foley is a 34 y.o. (DOB 1986/03/31) male.  Chief Complaint:  Chief Complaint  Patient presents with  . Follow-up  . Anxiety  . Depression    HPI Lance Foley presents to the office today for follow-up of major depression, OCD, generalized anxiety disorder, social anxiety disorder, and alcohol abuse.  Originally referrred by Lance Foley.   He was initially seen on June 26, 2019.  He had a high degree of anxiety over taking medications.  In fact he was fearful of medications.  Therefore he wanted to pursue GeneSight testing versus starting medications that were discussed with him.    On August 31 GeneSight testing was discussed with him in detail.  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   Based on these genetics it was suggested he might have a better response to this clomipramine versus standard SSRIs and that was recommended.  He wanted to research some more and discuss it at this appointment.  He was seen August 05, 2019.  Because of the genetic variations noted it was suggested that he try clomipramine first rather than go with a standard SSRI based on the genetics.  He wanted to defer starting that.  He called back October 8 stating he had tried Xanax for anxiety but it made him irritable and wanted to try an alternative so he was prescribed lorazepam 0.5 mg tablets 1-2 twice daily as needed.  He was cautioned not to take this with alcohol.  seen September 30, 2019.  On clomipramine 75 mg HS for 3 weeks.  Early SE resolved except tiredness at times but other times bursts of energy from nowhere. Poor after 3 hours of good sleep.  Restless sleep thereafter.   Is sleepy daytime at times.  No one else notices him as hyper.  Ordered serum clomipramine level at that  time which was 92 on a dose of 75 mg daily.  On December 1 he complained of not sleeping well with trazodone having nightmares.  That was switched to mirtazapine 7.5 mg nightly. In addition his low clomipramine level was discussed and it was recommended the increase to 100 mg daily and move it away from bedtime if possible to see if that was contributing to the insomnia.  He called back on December 14 wanting to increase clomipramine again because of not seeing benefit.  It is clearly not been long enough to see full benefit however based on the blood level it was reasonable to increase to 150 mg daily if he could tolerate it so he was given permission to do so.On 125 mg clomipramine for less than 3 weeks and can't tell much difference.  Doesn't feel it's helping as much as he'd like for depression and OCD but GAD is better with it.  seen November 12, 2019.  Mirtazapine was changed to hydroxyzine 25-50 nightly for sleep.  Abilify was added to potentiate the antidepressant.  Clomipramine was continued at 125 mg daily. He called pack complaining of poor sleep on January 19 and hydroxyzine was stopped and quetiapine 25 mg 1-2 nightly was prescribed for treatment resistant insomnia. Took Abilify 5 mg daily for 3 days and felt more depressed and stopped it.  No other SE.  Didn't see the point to taking it longer. Bad experience with Seroquel  with vivid dreams and didn't sleep really more.  Struck out in his sleep and fell out of bed.  Never acted out dreams before. Some difference in OCD with clomipramine but not better with anxiety and depression.  Less checking and leaves house quicker markedly.  Anxiety builds in afternoon with more irritable and negative ruminations and depressive thoughts.  Better in the morning. Sleep bad with 3 hours and toss and turn for hours thereafter.  Ativan 1 mg no longer helps.  No problems with sleep until clomipramine.   Don't feel tired daytime. Asks about Buspar which made  him sleepy in the past. Less worry and anxiety with the clomipramine but no improvement in OCD yet.  Struggles with motivation and depression and OCD and not sure which is worse.  seen February 2.  Because of symptoms ongoing as noted the following change was made: Increase clomipramine to 150 mg daily. Added lithium for potentiation.  seen January 21, 2020.  The following was noted: OCD better but not resolved with clomipramine and Buspar plus melatonin helps sleep.  Still depressed.  Anxiety and depressive symptoms were rare related is moderate. Broke up with GF of 3 years about 6 weeks ago and it's been hard.  Not accepted until a week ago.  Feels his lack of sexual interest was a problem.  This was a problem with her before the meds.  Had lost interest in her sexually before the meds. The following plan was made:Discussed higher than usual risk of problems with SSRIs due to the GeneSight genetics noted.  Discussed side effects of clomipramine.  He is having some side effects as noted above.  However increasing the dose could further reduce his anxiety and depression as well as OCD. Clomipramine level 92 at 75 mg .  Should be more solid in normal range at 125 mg with potential to increase to 150 mg.  Some insomnia with it. Taking it in AM now   Increase clomipramine to 200 mg daily.   Then check level again. No Ativan with alcohol.  Ok a few prn severe panic/anxiety #12/month.  Again encouraged him to further moderate his alcohol usage.  He is using alcohol to self medicate anxiety. Option Abilify potentiation to speed recovery.   Option retry bc he didn't give it a chance.   Yes Abilify 5 mg daily. Buspirone 30 mg HS for sleep and anxiety.  He reports in the past that he took buspirone and made him sleepy and he stopped it for that reason but wonders if it might help his sleep now.  It could also potentially help anxiety. wean lithium DT NR.    As of appointment February 24, 2020 the following is  reported: depression and anxiety are both better.  Starts day feeling good and positive with energy but around noon gets more tired and negative and depressed as day progresses.  Pattern even before the meds. Plan: Split clomipramine dosage in order to reduce side effects with a total dose of 200 mg daily. Potentiate with Abilify 10 mg daily. Check clomipramine level.   03/30/2020 phone call patient complaining of a lot of depression and anxiety for the last 3 weeks and he thought it was related to the Abilify increase.  Since the Abilify had not been helpful it was discontinued. 04/13/2020 phone call asking for increase in lorazepam for anxiety.  04/23/2020 appointment with the following noted: More depressed and more anxiety now than last time starting about a month ago and  no difference off Abilify. Sleep a lot better and thinks buspirone helps it. No SE clomipramine. Anxiety mainly at work and starts thinking about worry over the future.  Expect the worse.   PE recently was good.  Pt reports that mood is worse later in the day as noted  depressed moderate 6/10 and unchanged.   Anxious and describes anxiety as 4-5/10 which is better.Marland Kitchen Anxiety symptoms include: Excessive Worry, Obsessive Compulsive Symptoms:   as noted last appt,. Startled easily is not new but maybe worse.  Insomnia not as good on Abilify with 4-5 hours sleep/night, which is better.   Vivid dreams and can thrash.  No NM since the meds.  Did have before the meds.  Pt reports that appetite is good. Pt reports that energy is down significantly and no change and good. Concentration is down slightly. Suicidal thoughts:  denied by patient.    No panic since here. OCD is better.  Would take 10 min to leave house and now can leave in 2 mins with much less anxiety and easier to let it go. He had worsening insomnia with clomipramine  HS.    Work 4 days a week and drinks only 2-3 beers on off days maybe 6-7 beers. No change since last  here.  Case and a half weekly.  Does not drive after alcohol.    Asks for prn relief med not everyday for severe anxiety.    Past Psychiatric Medication Trials: Zoloft SE, Low dose Prozac for a year NR alprazolam irritable, lorazepam, Trazodone and mirtazapine bad dreams and tried 3-4 nights. Quetiapine 25 bad dreams, Unisom lost response  Wellbutrin rash Abilify some benefit but insomnia Lithium 600 NR Buspirone sleepy History of Wilber Bihari.  Not seen in 3-4 mos History of Andy Mitchum.   Review of Systems:  Review of Systems  Gastrointestinal: Negative for diarrhea and nausea.  Neurological: Positive for dizziness. Negative for tremors and weakness.  Psychiatric/Behavioral: Positive for dysphoric mood. Negative for agitation, confusion, decreased concentration and suicidal ideas. The patient is nervous/anxious.     Medications: I have reviewed the patient's current medications.  Current Outpatient Medications  Medication Sig Dispense Refill  . busPIRone (BUSPAR) 30 MG tablet Take 1 tablet (30 mg total) by mouth at bedtime. 90 tablet 0  . clomiPRAMINE (ANAFRANIL) 50 MG capsule 2 in AM and 3 at night 450 capsule 0  . LORazepam (ATIVAN) 1 MG tablet Take 1 tablet (1 mg total) by mouth every 8 (eight) hours as needed for anxiety. 30 tablet 0  . Multiple Vitamin (ONE-A-DAY MENS PO) Take by mouth.    . triamterene-hydrochlorothiazide (DYAZIDE) 37.5-25 MG per capsule   4  . ARIPiprazole (ABILIFY) 10 MG tablet TAKE 1 TABLET BY MOUTH EVERY DAY (Patient not taking: Reported on 04/23/2020) 90 tablet 0  . risperiDONE (RISPERDAL) 1 MG tablet 1 tablet at night, then 2 at night 60 tablet 1   No current facility-administered medications for this visit.    Medication Side Effects" : insomnia, low sex drive and weight gain, mild sweating, dry mouth  Allergies:  Allergies  Allergen Reactions  . Chantix [Varenicline] Other (See Comments)    intolerant  . Penicillins     REACTION: RASH  (any "cillins" meds  . Wellbutrin [Bupropion] Hives    Unsure if this was the cause     Past Medical History:  Diagnosis Date  . 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 Meniere  syndrome   . Substance abuse ([place])    alcohol abuse, active    Family History  Problem Relation Age of Onset  . Depression Mother   . Obesity Mother   . Healthy Sister     Social History   Socioeconomic History  . Marital status: Single    Spouse name: Not on file  . Number of children: Not on file  . Years of education: Not on file  . Highest education level: Not on file  Occupational History  . Occupation: Furniture conservator/restorer  Tobacco Use  . Smoking status: Current Every Day Smoker    Packs/day: 1.00    Years: 15.00    Pack years: 15.00    Types: Cigarettes  . Smokeless tobacco: Never Used  Substance and Sexual Activity  . Alcohol use: Yes    Alcohol/week: 168.0 standard drinks    Types: 168 Cans of beer per week    Comment: drinks a case of beer daily  . Drug use: Not Currently  . Sexual activity: Not Currently    Birth control/protection: Condom  Other Topics Concern  . Not on file  Social History Narrative  . Not on file   Social Determinants of Health   Financial Resource Strain:   . Difficulty of Paying Living Expenses:   Food Insecurity:   . Worried About Charity fundraiser in the Last Year:   . Arboriculturist in the Last Year:   Transportation Needs:   . Film/video editor (Medical):   Marland Kitchen Lack of Transportation (Non-Medical):   Physical Activity:   . Days of Exercise per Week:   . Minutes of Exercise per Session:   Stress:   . Feeling of Stress :   Social Connections:   . Frequency of Communication with Friends and Family:   . Frequency of Social Gatherings with Friends and Family:   . Attends Religious Services:   . Active Member of Clubs or Organizations:   . Attends Archivist Meetings:   Marland Kitchen Marital Status:   Intimate Partner Violence:   . Fear of  Current or Ex-Partner:   . Emotionally Abused:   Marland Kitchen Physically Abused:   . Sexually Abused:     Past Medical History, Surgical history, Social history, and Family history were reviewed and updated as appropriate.   Please see review of systems for further details on the patient's review from today.   Objective:   Physical Exam:  There were no vitals taken for this visit.  Physical Exam Constitutional:      General: He is not in acute distress.    Appearance: He is well-developed.  Musculoskeletal:        General: No deformity.  Neurological:     Mental Status: He is alert and oriented to person, place, and time.     Coordination: Coordination normal.  Psychiatric:        Attention and Perception: Attention and perception normal. He does not perceive auditory or visual hallucinations.        Mood and Affect: Mood is anxious and depressed. Affect is not labile, angry or inappropriate.        Speech: Speech normal.        Behavior: Behavior normal.        Thought Content: Thought content normal. Thought content does not include homicidal or suicidal ideation. Thought content does not include homicidal or suicidal plan.        Cognition and Memory: Cognition and  memory normal.        Judgment: Judgment normal.     Comments: Insight intact. No delusions.  OCD.  Not ruminative.      Lab Review:     Component Value Date/Time   NA 138 09/16/2019 0900   K 4.1 09/16/2019 0900   CL 98 09/16/2019 0900   CO2 31 09/16/2019 0900   GLUCOSE 96 09/16/2019 0900   BUN 21 09/16/2019 0900   CREATININE 0.89 09/16/2019 0900   CALCIUM 9.8 09/16/2019 0900   PROT 6.8 09/16/2019 0900   ALBUMIN 4.6 09/16/2019 0900   AST 19 09/16/2019 0900   ALT 29 09/16/2019 0900   ALKPHOS 102 09/16/2019 0900   BILITOT 0.4 09/16/2019 0900   GFRNONAA 98 01/30/2008 1430   GFRAA 119 01/30/2008 1430       Component Value Date/Time   WBC 15.7 (H) 09/16/2019 0900   RBC 5.10 09/16/2019 0900   HGB 16.5  09/16/2019 0900   HCT 48.1 09/16/2019 0900   PLT 244.0 09/16/2019 0900   MCV 94.3 09/16/2019 0900   MCHC 34.2 09/16/2019 0900   RDW 13.2 09/16/2019 0900   LYMPHSABS 2.5 09/16/2019 0900   MONOABS 1.0 09/16/2019 0900   EOSABS 0.2 09/16/2019 0900   BASOSABS 0.1 09/16/2019 0900    No results found for: POCLITH, LITHIUM   No results found for: PHENYTOIN, PHENOBARB, VALPROATE, CBMZ   September 30, 2019 clomipramine  was 92 on a dose of 75 mg daily.  02/24/2020 clomipramine level 225 on 200 mg daily.  Genesight testing   .res Assessment: Plan:    Satchel was seen today for follow-up, anxiety and depression.  Diagnoses and all orders for this visit:  Major depressive disorder, recurrent episode, moderate (HCC) -     clomiPRAMINE (ANAFRANIL) 50 MG capsule; 2 in AM and 3 at night  Mixed obsessional thoughts and acts -     clomiPRAMINE (ANAFRANIL) 50 MG capsule; 2 in AM and 3 at night -     risperiDONE (RISPERDAL) 1 MG tablet; 1 tablet at night, then 2 at night -     LORazepam (ATIVAN) 1 MG tablet; Take 1 tablet (1 mg total) by mouth every 8 (eight) hours as needed for anxiety.  Generalized anxiety disorder -     clomiPRAMINE (ANAFRANIL) 50 MG capsule; 2 in AM and 3 at night -     busPIRone (BUSPAR) 30 MG tablet; Take 1 tablet (30 mg total) by mouth at bedtime.  Social anxiety disorder -     clomiPRAMINE (ANAFRANIL) 50 MG capsule; 2 in AM and 3 at night  Insomnia due to mental condition -     busPIRone (BUSPAR) 30 MG tablet; Take 1 tablet (30 mg total) by mouth at bedtime.    Discussed his ongoing problems with depression, anxiety, OCD, and treatment resistant insomnia as well.  Primary intrusive thoughts are sexual.  Can obsess over something bac from 20 years ago. The OCD is partially responsive to the clomipramine.  He is seeing improvement in the OCD.  However the anxiety depression and insomnia persist but are some better.  As noted he has had side effects with various things of  that have been tried.  .  Reverse diurnal pattern of sx.  Reviewed discussion Genesight.    Discussed higher than usual risk of problems with SSRIs due to the GeneSight genetics noted.  Discussed side effects of clomipramine.  He is having some side effects as noted above.  However increasing the dose  could further reduce his anxiety and depression as well as OCD. Clomipramine level 92 at 75 mg .  Should be more solid in normal range at 125 mg with potential to increase to 150 mg.  Some insomnia with it. Taking it in AM now   Clomipramine level 02/24/20 is 225 on 200 mg daily. Increase clomipramine to 250 mg daily.  Risperidone augmentation 1-2 mg HS. Discussed potential metabolic side effects associated with atypical antipsychotics, as well as potential risk for movement side effects. Advised pt to contact office if movement side effects occur.   Residual sx of depression and anxiety.    No Ativan with alcohol.  Ok a few prn severe panic/anxiety #12/month.  Again encouraged him to further moderate his alcohol usage.  He is using alcohol to self medicate anxiety.  Option retry Abilify potentiation above 10 mg  to speed recovery.  Option retry lithium at higher dose with clomipramine.  Vraylar.   Reduce alsohol and consider Campral or naltrexone which might help OCD ioff label.  Buspirone 30 mg HS for sleep and anxiety.  He reports in the past that he took buspirone and made him sleepy and he stopped it for that reason but wonders if it might help his sleep now.  It could also potentially help anxiety.  Insomnia is a chronic problem.  Concerns about using BZ bc of alcohol.  Failed several sleep meds.  Recommend counseling with OCD therapist. Emphasized the importance of ERP.      Disc his request for gun permit and concealed carry.    4-6 weeks  Lynder Parents, MD, DFAPA   Please see After Visit Summary for patient specific instructions.  Increase clomipramine to 5 capsules  daily  Add risperidone 1 mg at night for 1 week and if no improvement increase to 2 tablets each night.  If no improvement with risperidone 2 mg after 2 weeks call and will increase to 3 mg.  No future appointments.  No orders of the defined types were placed in this encounter.   -------------------------------

## 2020-05-06 ENCOUNTER — Telehealth: Payer: Self-pay | Admitting: Psychiatry

## 2020-05-06 NOTE — Telephone Encounter (Signed)
Pt in office 6/17. Started Risperidone 1 mg advised if no change call office and can increase to 2 mg Reporting no change. Advise if ok to increase.  [phone].

## 2020-05-07 NOTE — Telephone Encounter (Signed)
LM okay to increase dose to 2 mg Risperdal at hs and to call back with an update.

## 2020-05-21 ENCOUNTER — Other Ambulatory Visit: Payer: Self-pay | Admitting: Psychiatry

## 2020-05-21 DIAGNOSIS — F422 Mixed obsessional thoughts and acts: Secondary | ICD-10-CM

## 2020-06-03 ENCOUNTER — Ambulatory Visit (INDEPENDENT_AMBULATORY_CARE_PROVIDER_SITE_OTHER)
Admission: RE | Admit: 2020-06-03 | Discharge: 2020-06-03 | Disposition: A | Discharge status: Home / Self Care | Payer: Managed Care, Other (non HMO) | Source: Ambulatory Visit | Attending: Family Medicine | Admitting: Family Medicine

## 2020-06-03 ENCOUNTER — Other Ambulatory Visit: Payer: Self-pay

## 2020-06-03 ENCOUNTER — Ambulatory Visit: Payer: Managed Care, Other (non HMO) | Admitting: Family Medicine

## 2020-06-03 ENCOUNTER — Encounter: Payer: Self-pay | Admitting: Family Medicine

## 2020-06-03 VITALS — BP 158/92 | HR 115 | Temp 97.8°F | Ht 68.5 in | Wt 242.2 lb

## 2020-06-03 DIAGNOSIS — F17200 Nicotine dependence, unspecified, uncomplicated: Secondary | ICD-10-CM | POA: Diagnosis not present

## 2020-06-03 DIAGNOSIS — R0789 Other chest pain: Secondary | ICD-10-CM | POA: Diagnosis not present

## 2020-06-03 DIAGNOSIS — R030 Elevated blood-pressure reading, without diagnosis of hypertension: Secondary | ICD-10-CM | POA: Diagnosis not present

## 2020-06-03 MED ORDER — TIZANIDINE HCL 4 MG PO TABS
4.0000 mg | ORAL_TABLET | Freq: Three times a day (TID) | ORAL | 0 refills | Status: DC | PRN
Start: 2020-06-03 — End: 2020-08-12

## 2020-06-03 NOTE — Patient Instructions (Addendum)
Use ice and or heat on the painful area -whichever feels best Ibuprofen as needed with food   Try tizanadinel for severe pain (muscle relaxer)  as needed (caution of sedation and do not take with alcohol)   Xray now  We will call you with a report   If you become short of breath or have cough/fever let us know

## 2020-06-03 NOTE — Assessment & Plan Note (Signed)
L lateral cw pain in smoker  Also alcohol dependent Unsure if we can r/o trauma (also occ hard cough) cxr and rib films today  Adv ice /heat  Tizanidine for spasm/pain  Disc imp of deep breaths to prevent atelectasis

## 2020-06-03 NOTE — Assessment & Plan Note (Signed)
Pt in pain today-elevated bp and pulse  Denies recent etoh w/d
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
## 2020-06-03 NOTE — Assessment & Plan Note (Signed)
No change in smoking status Disc in detail risks of smoking and possible outcomes including copd, vascular/ heart disease, cancer , respiratory and sinus infections  Pt voices understanding Not ready to quit

## 2020-06-10 ENCOUNTER — Encounter: Payer: Self-pay | Admitting: Psychiatry

## 2020-06-10 ENCOUNTER — Other Ambulatory Visit: Payer: Self-pay

## 2020-06-10 ENCOUNTER — Ambulatory Visit (INDEPENDENT_AMBULATORY_CARE_PROVIDER_SITE_OTHER): Payer: Self-pay | Admitting: Psychiatry

## 2020-06-10 DIAGNOSIS — F4010 Social phobia, unspecified: Secondary | ICD-10-CM

## 2020-06-10 DIAGNOSIS — F5105 Insomnia due to other mental disorder: Secondary | ICD-10-CM

## 2020-06-10 DIAGNOSIS — F331 Major depressive disorder, recurrent, moderate: Secondary | ICD-10-CM

## 2020-06-10 DIAGNOSIS — F411 Generalized anxiety disorder: Secondary | ICD-10-CM

## 2020-06-10 DIAGNOSIS — F1010 Alcohol abuse, uncomplicated: Secondary | ICD-10-CM

## 2020-06-10 DIAGNOSIS — F422 Mixed obsessional thoughts and acts: Secondary | ICD-10-CM

## 2020-06-10 MED ORDER — DULOXETINE HCL 30 MG PO CPEP: ORAL_CAPSULE | ORAL | 1 refills | Status: DC

## 2020-06-10 MED ORDER — LORAZEPAM 1 MG PO TABS: 1.0000 mg | ORAL_TABLET | Freq: Three times a day (TID) | ORAL | 0 refills | Status: DC | PRN

## 2020-06-10 NOTE — Progress Notes (Signed)
HILLIARD BORGES [id number] Apr 07, 1986 34 y.o.  Subjective:   Patient ID:  Lance Foley is a 34 y.o. (DOB [birth year]) male.  Chief Complaint:  Chief Complaint  Patient presents with  . Follow-up  . Depression  . Anxiety    HPI DELORIS MITTAG presents to the office today for follow-up of major depression, OCD, generalized anxiety disorder, social anxiety disorder, and alcohol abuse.  Originally referrred by Lendell Caprice.   He was initially seen on June 26, 2019.  He had a high degree of anxiety over taking medications.  In fact he was fearful of medications.  Therefore he wanted to pursue GeneSight testing versus starting medications that were discussed with him.    On August 31 GeneSight testing was discussed with him in detail.  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   Based on these genetics it was suggested he might have a better response to this clomipramine versus standard SSRIs and that was recommended.  He wanted to research some more and discuss it at this appointment.  He was seen August 05, 2019.  Because of the genetic variations noted it was suggested that he try clomipramine first rather than go with a standard SSRI based on the genetics.  He wanted to defer starting that.  He called back October 8 stating he had tried Xanax for anxiety but it made him irritable and wanted to try an alternative so he was prescribed lorazepam 0.5 mg tablets 1-2 twice daily as needed.  He was cautioned not to take this with alcohol.  seen September 30, 2019.  On clomipramine 75 mg HS for 3 weeks.  Early SE resolved except tiredness at times but other times bursts of energy from nowhere. Poor after 3 hours of good sleep.  Restless sleep thereafter.   Is sleepy daytime at times.  No one else notices him as hyper.  Ordered serum clomipramine level at that  time which was 92 on a dose of 75 mg daily.  On December 1 he complained of not sleeping well with trazodone having nightmares.  That was switched to mirtazapine 7.5 mg nightly. In addition his low clomipramine level was discussed and it was recommended the increase to 100 mg daily and move it away from bedtime if possible to see if that was contributing to the insomnia.  He called back on December 14 wanting to increase clomipramine again because of not seeing benefit.  It is clearly not been long enough to see full benefit however based on the blood level it was reasonable to increase to 150 mg daily if he could tolerate it so he was given permission to do so.On 125 mg clomipramine for less than 3 weeks and can't tell much difference.  Doesn't feel it's helping as much as he'd like for depression and OCD but GAD is better with it.  seen November 12, 2019.  Mirtazapine was changed to hydroxyzine 25-50 nightly for sleep.  Abilify was added to potentiate the antidepressant.  Clomipramine was continued at 125 mg daily. He called pack complaining of poor sleep on January 19 and hydroxyzine was stopped and quetiapine 25 mg 1-2 nightly was prescribed for treatment resistant insomnia. Took Abilify 5 mg daily for 3 days and felt more depressed and stopped it.  No other SE.  Didn't see the point to taking it longer. Bad experience with Seroquel  with vivid dreams and didn't sleep really more.  Struck out in his sleep and fell out of bed.  Never acted out dreams before. Some difference in OCD with clomipramine but not better with anxiety and depression.  Less checking and leaves house quicker markedly.  Anxiety builds in afternoon with more irritable and negative ruminations and depressive thoughts.  Better in the morning. Sleep bad with 3 hours and toss and turn for hours thereafter.  Ativan 1 mg no longer helps.  No problems with sleep until clomipramine.   Don't feel tired daytime. Asks about Buspar which made  him sleepy in the past. Less worry and anxiety with the clomipramine but no improvement in OCD yet.  Struggles with motivation and depression and OCD and not sure which is worse.  seen February 2.  Because of symptoms ongoing as noted the following change was made: Increase clomipramine to 150 mg daily. Added lithium for potentiation.  seen January 21, 2020.  The following was noted: OCD better but not resolved with clomipramine and Buspar plus melatonin helps sleep.  Still depressed.  Anxiety and depressive symptoms were rare related is moderate. Broke up with GF of 3 years about 6 weeks ago and it's been hard.  Not accepted until a week ago.  Feels his lack of sexual interest was a problem.  This was a problem with her before the meds.  Had lost interest in her sexually before the meds. The following plan was made:Discussed higher than usual risk of problems with SSRIs due to the GeneSight genetics noted.  Discussed side effects of clomipramine.  He is having some side effects as noted above.  However increasing the dose could further reduce his anxiety and depression as well as OCD. Clomipramine level 92 at 75 mg .  Should be more solid in normal range at 125 mg with potential to increase to 150 mg.  Some insomnia with it. Taking it in AM now   Increase clomipramine to 200 mg daily.   Then check level again. No Ativan with alcohol.  Ok a few prn severe panic/anxiety #12/month.  Again encouraged him to further moderate his alcohol usage.  He is using alcohol to self medicate anxiety. Option Abilify potentiation to speed recovery.   Option retry bc he didn't give it a chance.   Yes Abilify 5 mg daily. Buspirone 30 mg HS for sleep and anxiety.  He reports in the past that he took buspirone and made him sleepy and he stopped it for that reason but wonders if it might help his sleep now.  It could also potentially help anxiety. wean lithium DT NR.    As of appointment February 24, 2020 the following is  reported: depression and anxiety are both better.  Starts day feeling good and positive with energy but around noon gets more tired and negative and depressed as day progresses.  Pattern even before the meds. Plan: Split clomipramine dosage in order to reduce side effects with a total dose of 200 mg daily. Potentiate with Abilify 10 mg daily. Check clomipramine level.   03/30/2020 phone call patient complaining of a lot of depression and anxiety for the last 3 weeks and he thought it was related to the Abilify increase.  Since the Abilify had not been helpful it was discontinued. 04/13/2020 phone call asking for increase in lorazepam for anxiety.  04/23/2020 appointment with the following noted: More depressed and more anxiety now than last time starting about a month ago and  no difference off Abilify. Sleep a lot better and thinks buspirone helps it. No SE clomipramine. Anxiety mainly at work and starts thinking about worry over the future.  Expect the worse. Plan: Clomipramine level 02/24/20 is 225 on 200 mg daily. Increase clomipramine to 250 mg daily. Risperidone augmentation 1-2 mg HS.  06/10/20 appt with the following noted: No benefit but gaining weight and night sweats. No sig change in anxiety or depression.  No panic.   Just started new job so social anxiety.  And worry about the new job bc had old one for 7 years.  Making more money and still not happy. Sadness and hopeless ness.  Going through motions of life.  No joy. Depression worse than anxiety.  PE recently was good.  Pt reports that mood is worse later in the day as noted  depressed moderate 6/10 and unchanged.   Anxious and describes anxiety as 4-5/10 which is better.Marland Kitchen Anxiety symptoms include: Excessive Worry, Obsessive Compulsive Symptoms:   as noted last appt,. Startled easily is not new but maybe worse. Sleep good but night sweats  No NM since the meds.  Did have before the meds.  Pt reports that appetite is good. Pt  reports that energy is down significantly and no change and good. Concentration is down slightly. Suicidal thoughts:  denied by patient.    No panic since here. OCD is better.  Would take 10 min to leave house and now can leave in 2 mins with much less anxiety and easier to let it go. He had worsening insomnia with clomipramine  HS.    Work 4 days a week and drinks only 2-3 beers on off days maybe 6-7 beers. No change since last here.  Case and a half weekly.  Does not drive after alcohol.    Asks for prn relief med not everyday for severe anxiety.    Past Psychiatric Medication Trials: Zoloft SE, Low dose Prozac for a year NR alprazolam irritable, lorazepam, Trazodone and mirtazapine bad dreams and tried 3-4 nights. Quetiapine 25 bad dreams, Unisom lost response  Wellbutrin rash Abilify some benefit but insomnia Lithium 600 NR  Buspirone sleepy History of Wilber Bihari.  Not seen in 3-4 mos History of Andy Mitchum.   Review of Systems:  Review of Systems  Gastrointestinal: Negative for diarrhea and nausea.  Neurological: Positive for dizziness. Negative for tremors and weakness.  Psychiatric/Behavioral: Positive for dysphoric mood. Negative for agitation, confusion, decreased concentration and suicidal ideas. The patient is nervous/anxious.     Medications: I have reviewed the patient's current medications.  Current Outpatient Medications  Medication Sig Dispense Refill  . busPIRone (BUSPAR) 30 MG tablet Take 1 tablet (30 mg total) by mouth at bedtime. 90 tablet 0  . clomiPRAMINE (ANAFRANIL) 50 MG capsule 2 in AM and 3 at night 450 capsule 0  . LORazepam (ATIVAN) 1 MG tablet Take 1 tablet (1 mg total) by mouth every 8 (eight) hours as needed for anxiety. 30 tablet 0  . Multiple Vitamin (ONE-A-DAY MENS PO) Take by mouth.    Marland Kitchen tiZANidine (ZANAFLEX) 4 MG tablet Take 1 tablet (4 mg total) by mouth every 8 (eight) hours as needed for muscle spasms (chest wall pain). Caution of  sedation 30 tablet 0  . triamterene-hydrochlorothiazide (DYAZIDE) 37.5-25 MG per capsule   4  . DULoxetine (CYMBALTA) 30 MG capsule 1 daily for 5 days, then 2 daily for 5 days, then 3 daily 90 capsule 1   No current facility-administered  medications for this visit.    Medication Side Effects" : insomnia, low sex drive and weight gain, mild sweating, dry mouth  Allergies:  Allergies  Allergen Reactions  . Chantix [Varenicline] Other (See Comments)    intolerant  . Penicillins     REACTION: RASH (any "cillins" meds  . Wellbutrin [Bupropion] Hives    Unsure if this was the cause     Past Medical History:  Diagnosis Date  . 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 Meniere syndrome   . Substance abuse ([place])    alcohol abuse, active    Family History  Problem Relation Age of Onset  . Depression Mother   . Obesity Mother   . Healthy Sister     Social History   Socioeconomic History  . Marital status: Single    Spouse name: Not on file  . Number of children: Not on file  . Years of education: Not on file  . Highest education level: Not on file  Occupational History  . Occupation: Furniture conservator/restorer  Tobacco Use  . Smoking status: Current Every Day Smoker    Packs/day: 1.00    Years: 15.00    Pack years: 15.00    Types: Cigarettes  . Smokeless tobacco: Never Used  Substance and Sexual Activity  . Alcohol use: Yes    Alcohol/week: 168.0 standard drinks    Types: 168 Cans of beer per week    Comment: drinks a case of beer daily  . Drug use: Not Currently  . Sexual activity: Not Currently    Birth control/protection: Condom  Other Topics Concern  . Not on file  Social History Narrative  . Not on file   Social Determinants of Health   Financial Resource Strain:   . Difficulty of Paying Living Expenses:   Food Insecurity:   . Worried About Charity fundraiser in the Last Year:   . Arboriculturist in the Last Year:   Transportation Needs:   . Lexicographer (Medical):   Marland Kitchen Lack of Transportation (Non-Medical):   Physical Activity:   . Days of Exercise per Week:   . Minutes of Exercise per Session:   Stress:   . Feeling of Stress :   Social Connections:   . Frequency of Communication with Friends and Family:   . Frequency of Social Gatherings with Friends and Family:   . Attends Religious Services:   . Active Member of Clubs or Organizations:   . Attends Archivist Meetings:   Marland Kitchen Marital Status:   Intimate Partner Violence:   . Fear of Current or Ex-Partner:   . Emotionally Abused:   Marland Kitchen Physically Abused:   . Sexually Abused:     Past Medical History, Surgical history, Social history, and Family history were reviewed and updated as appropriate.   Please see review of systems for further details on the patient's review from today.   Objective:   Physical Exam:  There were no vitals taken for this visit.  Physical Exam Constitutional:      General: He is not in acute distress.    Appearance: He is well-developed.  Musculoskeletal:        General: No deformity.  Neurological:     Mental Status: He is alert and oriented to person, place, and time.     Coordination: Coordination normal.  Psychiatric:        Attention and Perception: Attention and  perception normal. He does not perceive auditory or visual hallucinations.        Mood and Affect: Mood is anxious and depressed. Affect is not labile, angry or inappropriate.        Speech: Speech normal.        Behavior: Behavior normal.        Thought Content: Thought content normal. Thought content does not include homicidal or suicidal ideation. Thought content does not include homicidal or suicidal plan.        Cognition and Memory: Cognition and memory normal.        Judgment: Judgment normal.     Comments: Insight intact. No delusions.  OCD.  Not ruminative.      Lab Review:     Component Value Date/Time   NA 138 09/16/2019 0900   K 4.1  09/16/2019 0900   CL 98 09/16/2019 0900   CO2 31 09/16/2019 0900   GLUCOSE 96 09/16/2019 0900   BUN 21 09/16/2019 0900   CREATININE 0.89 09/16/2019 0900   CALCIUM 9.8 09/16/2019 0900   PROT 6.8 09/16/2019 0900   ALBUMIN 4.6 09/16/2019 0900   AST 19 09/16/2019 0900   ALT 29 09/16/2019 0900   ALKPHOS 102 09/16/2019 0900   BILITOT 0.4 09/16/2019 0900   GFRNONAA 98 01/30/2008 1430   GFRAA 119 01/30/2008 1430       Component Value Date/Time   WBC 15.7 (H) 09/16/2019 0900   RBC 5.10 09/16/2019 0900   HGB 16.5 09/16/2019 0900   HCT 48.1 09/16/2019 0900   PLT 244.0 09/16/2019 0900   MCV 94.3 09/16/2019 0900   MCHC 34.2 09/16/2019 0900   RDW 13.2 09/16/2019 0900   LYMPHSABS 2.5 09/16/2019 0900   MONOABS 1.0 09/16/2019 0900   EOSABS 0.2 09/16/2019 0900   BASOSABS 0.1 09/16/2019 0900    No results found for: POCLITH, LITHIUM   No results found for: PHENYTOIN, PHENOBARB, VALPROATE, CBMZ   September 30, 2019 clomipramine  was 92 on a dose of 75 mg daily.  02/24/2020 clomipramine level 225 on 200 mg daily.  Genesight testing   .res Assessment: Plan:    Luiscarlos was seen today for follow-up, depression and anxiety.  Diagnoses and all orders for this visit:  Major depressive disorder, recurrent episode, moderate (HCC) -     Discontinue: DULoxetine (CYMBALTA) 30 MG capsule; 1 daily for 5 days, then 2 daily for 5 days, then 3 daily -     DULoxetine (CYMBALTA) 30 MG capsule; 1 daily for 5 days, then 2 daily for 5 days, then 3 daily  Mixed obsessional thoughts and acts -     Discontinue: DULoxetine (CYMBALTA) 30 MG capsule; 1 daily for 5 days, then 2 daily for 5 days, then 3 daily -     DULoxetine (CYMBALTA) 30 MG capsule; 1 daily for 5 days, then 2 daily for 5 days, then 3 daily -     LORazepam (ATIVAN) 1 MG tablet; Take 1 tablet (1 mg total) by mouth every 8 (eight) hours as needed for anxiety.  Generalized anxiety disorder -     Discontinue: DULoxetine (CYMBALTA) 30 MG capsule; 1  daily for 5 days, then 2 daily for 5 days, then 3 daily -     DULoxetine (CYMBALTA) 30 MG capsule; 1 daily for 5 days, then 2 daily for 5 days, then 3 daily  Social anxiety disorder -     Discontinue: DULoxetine (CYMBALTA) 30 MG capsule; 1 daily for 5 days, then 2 daily  for 5 days, then 3 daily -     DULoxetine (CYMBALTA) 30 MG capsule; 1 daily for 5 days, then 2 daily for 5 days, then 3 daily  Insomnia due to mental condition  Alcohol abuse    Discussed his ongoing problems with depression, anxiety, OCD, and treatment resistant insomnia as well.  Primary intrusive thoughts are sexual.  Can obsess over something bac from 20 years ago. The OCD is partially responsive to the clomipramine.  He is seeing improvement in the OCD.  However the anxiety depression persist markedly.  As noted he has had side effects with various things of that have been tried.  .  Reverse diurnal pattern of sx.  Discussed higher than usual risk of problems with SSRIs due to the GeneSight genetics noted.   Risperidone augmentation failure Discussed potential metabolic side effects associated with atypical antipsychotics, as well as potential risk for movement side effects. Advised pt to contact office if movement side effects occur.    Residual sx of depression and anxiety.    No Ativan with alcohol.  Ok a few prn severe panic/anxiety #12/month.  Again encouraged him to further moderate his alcohol usage.  He is using alcohol to self medicate anxiety.  Option retry Abilify potentiation above 10 mg  to speed recovery.  Option retry lithium at higher dose with clomipramine.  Vraylar or Rexulti off label Option Trintellix.  Reduce alsohol and consider Campral or naltrexone which might help OCD ioff label.  Buspirone 30 mg HS for sleep and anxiety.  He reports in the past that he took buspirone and made him sleepy and he stopped it for that reason but wonders if it might help his sleep now.  It could also potentially  help anxiety.  Insomnia is a chronic problem.  Concerns about using BZ bc of alcohol.  Failed several sleep meds.  Recommend counseling with OCD therapist. Emphasized the importance of ERP.      Disc his request for gun permit and concealed carry.    6-7 weeks  Please see After Visit Summary for patient specific instructions.  Start duloxetine 1 daily and reduce clomipramine to 3 daily for 5 days, Then increase duloxetine to 2 daily and reduce clomipramine to 2 daily for 5 days, then increase duloxetine to 3 daily and continue clomipramine 2 daily  Reduce risperidone to 1 nightly for 10 days then stop it  Lynder Parents, MD, DFAPA   No future appointments.  No orders of the defined types were placed in this encounter.   -------------------------------

## 2020-06-10 NOTE — Patient Instructions (Signed)
Start duloxetine 1 daily and reduce clomipramine to 3 daily for 5 days, Then increase duloxetine to 2 daily and reduce clomipramine to 2 daily for 5 days, then increase duloxetine to 3 daily and continue clomipramine 2 daily  Reduce risperidone to 1 nightly for 10 days then stop it

## 2020-06-23 ENCOUNTER — Ambulatory Visit: Payer: 59 | Admitting: Psychiatry

## 2020-06-23 ENCOUNTER — Other Ambulatory Visit: Payer: Self-pay | Admitting: Psychiatry

## 2020-06-23 DIAGNOSIS — F4010 Social phobia, unspecified: Secondary | ICD-10-CM

## 2020-06-23 DIAGNOSIS — F411 Generalized anxiety disorder: Secondary | ICD-10-CM

## 2020-06-23 DIAGNOSIS — F422 Mixed obsessional thoughts and acts: Secondary | ICD-10-CM

## 2020-06-23 DIAGNOSIS — F331 Major depressive disorder, recurrent, moderate: Secondary | ICD-10-CM

## 2020-06-25 NOTE — Telephone Encounter (Signed)
90 day okay? Update instructions?

## 2020-07-16 ENCOUNTER — Other Ambulatory Visit: Payer: Self-pay | Admitting: Psychiatry

## 2020-07-16 DIAGNOSIS — F4010 Social phobia, unspecified: Secondary | ICD-10-CM

## 2020-07-16 DIAGNOSIS — F422 Mixed obsessional thoughts and acts: Secondary | ICD-10-CM

## 2020-07-16 DIAGNOSIS — F331 Major depressive disorder, recurrent, moderate: Secondary | ICD-10-CM

## 2020-07-16 DIAGNOSIS — F411 Generalized anxiety disorder: Secondary | ICD-10-CM

## 2020-07-16 NOTE — Telephone Encounter (Signed)
Please review

## 2020-08-07 ENCOUNTER — Other Ambulatory Visit: Payer: Self-pay | Admitting: Psychiatry

## 2020-08-07 DIAGNOSIS — F422 Mixed obsessional thoughts and acts: Secondary | ICD-10-CM

## 2020-08-10 ENCOUNTER — Other Ambulatory Visit: Payer: Self-pay | Admitting: Psychiatry

## 2020-08-10 DIAGNOSIS — F5105 Insomnia due to other mental disorder: Secondary | ICD-10-CM

## 2020-08-10 DIAGNOSIS — F411 Generalized anxiety disorder: Secondary | ICD-10-CM

## 2020-08-12 ENCOUNTER — Telehealth (INDEPENDENT_AMBULATORY_CARE_PROVIDER_SITE_OTHER): Payer: 59 | Admitting: Psychiatry

## 2020-08-12 ENCOUNTER — Encounter: Payer: Self-pay | Admitting: Psychiatry

## 2020-08-12 DIAGNOSIS — F411 Generalized anxiety disorder: Secondary | ICD-10-CM

## 2020-08-12 DIAGNOSIS — F4010 Social phobia, unspecified: Secondary | ICD-10-CM

## 2020-08-12 DIAGNOSIS — F422 Mixed obsessional thoughts and acts: Secondary | ICD-10-CM

## 2020-08-12 DIAGNOSIS — F5105 Insomnia due to other mental disorder: Secondary | ICD-10-CM

## 2020-08-12 DIAGNOSIS — F1010 Alcohol abuse, uncomplicated: Secondary | ICD-10-CM

## 2020-08-12 DIAGNOSIS — F331 Major depressive disorder, recurrent, moderate: Secondary | ICD-10-CM

## 2020-08-12 MED ORDER — VENLAFAXINE HCL ER 75 MG PO CP24: ORAL_CAPSULE | ORAL | 1 refills | Status: DC

## 2020-08-12 NOTE — Progress Notes (Signed)
Lance Foley [id number] November 25, 1985 34 y.o.   Video Visit via My Chart  I connected with pt by My Chart and verified that I am speaking with the correct person using two identifiers.   I discussed the limitations, risks, security and privacy concerns of performing an evaluation and management service by My Chart  and the availability of in person appointments. I also discussed with the patient that there may be a patient responsible charge related to this service. The patient expressed understanding and agreed to proceed.  I discussed the assessment and treatment plan with the patient. The patient was provided an opportunity to ask questions and all were answered. The patient agreed with the plan and demonstrated an understanding of the instructions.   The patient was advised to call back or seek an in-person evaluation if the symptoms worsen or if the condition fails to improve as anticipated.  I provided 30 minutes of video time during this encounter.  The patient was located at home and the provider was located office. Session started 11 and ended 1130.  Subjective:   Patient ID:  Lance Foley is a 34 y.o. (DOB Jan 31, 1986) male.  Chief Complaint:  Chief Complaint  Patient presents with  . Follow-up    Medication Management  . Depression    Medication Management  . Anxiety  . Sleeping Problem    Depression        Associated symptoms include no decreased concentration and no suicidal ideas.  [name] presents to the office today for follow-up of major depression, OCD, generalized anxiety disorder, social anxiety disorder, and alcohol abuse.  Originally referrred by Lendell Caprice.   He was initially seen on June 26, 2019.  He had a high degree of anxiety over taking medications.  In fact he was fearful of medications.  Therefore he wanted to pursue GeneSight testing versus starting medications that were discussed with him.    On August 31 GeneSight testing was discussed  with him in detail.  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   Based on these genetics it was suggested he might have a better response to this clomipramine versus standard SSRIs and that was recommended.  He wanted to research some more and discuss it at this appointment.  He was seen August 05, 2019.  Because of the genetic variations noted it was suggested that he try clomipramine first rather than go with a standard SSRI based on the genetics.  He wanted to defer starting that.  He called back October 8 stating he had tried Xanax for anxiety but it made him irritable and wanted to try an alternative so he was prescribed lorazepam 0.5 mg tablets 1-2 twice daily as needed.  He was cautioned not to take this with alcohol.  seen September 30, 2019.  On clomipramine 75 mg HS for 3 weeks.  Early SE resolved except tiredness at times but other times bursts of energy from nowhere. Poor after 3 hours of good sleep.  Restless sleep thereafter.   Is sleepy daytime at times.  No one else notices him as hyper.  Ordered serum clomipramine level at that time which was 92 on a dose of 75 mg daily.  On December 1 he complained of not sleeping well with trazodone having nightmares.  That was switched to mirtazapine 7.5 mg nightly. In addition his low clomipramine  level was discussed and it was recommended the increase to 100 mg daily and move it away from bedtime if possible to see if that was contributing to the insomnia.  He called back on December 14 wanting to increase clomipramine again because of not seeing benefit.  It is clearly not been long enough to see full benefit however based on the blood level it was reasonable to increase to 150 mg daily if he could tolerate it so he was given permission to do so.On 125 mg clomipramine for less than 3 weeks and can't tell much  difference.  Doesn't feel it's helping as much as he'd like for depression and OCD but GAD is better with it.  seen November 12, 2019.  Mirtazapine was changed to hydroxyzine 25-50 nightly for sleep.  Abilify was added to potentiate the antidepressant.  Clomipramine was continued at 125 mg daily. He called pack complaining of poor sleep on January 19 and hydroxyzine was stopped and quetiapine 25 mg 1-2 nightly was prescribed for treatment resistant insomnia. Took Abilify 5 mg daily for 3 days and felt more depressed and stopped it.  No other SE.  Didn't see the point to taking it longer. Bad experience with Seroquel with vivid dreams and didn't sleep really more.  Struck out in his sleep and fell out of bed.  Never acted out dreams before. Some difference in OCD with clomipramine but not better with anxiety and depression.  Less checking and leaves house quicker markedly.  Anxiety builds in afternoon with more irritable and negative ruminations and depressive thoughts.  Better in the morning. Sleep bad with 3 hours and toss and turn for hours thereafter.  Ativan 1 mg no longer helps.  No problems with sleep until clomipramine.   Don't feel tired daytime. Asks about Buspar which made him sleepy in the past. Less worry and anxiety with the clomipramine but no improvement in OCD yet.  Struggles with motivation and depression and OCD and not sure which is worse.  seen February 2.  Because of symptoms ongoing as noted the following change was made: Increase clomipramine to 150 mg daily. Added lithium for potentiation.  seen January 21, 2020.  The following was noted: OCD better but not resolved with clomipramine and Buspar plus melatonin helps sleep.  Still depressed.  Anxiety and depressive symptoms were rare related is moderate. Broke up with GF of 3 years about 6 weeks ago and it's been hard.  Not accepted until a week ago.  Feels his lack of sexual interest was a problem.  This was a problem with her  before the meds.  Had lost interest in her sexually before the meds. The following plan was made:Discussed higher than usual risk of problems with SSRIs due to the GeneSight genetics noted.  Discussed side effects of clomipramine.  He is having some side effects as noted above.  However increasing the dose could further reduce his anxiety and depression as well as OCD. Clomipramine level 92 at 75 mg .  Should be more solid in normal range at 125 mg with potential to increase to 150 mg.  Some insomnia with it. Taking it in AM now   Increase clomipramine to 200 mg daily.   Then check level again. No Ativan with alcohol.  Ok a few prn severe panic/anxiety #12/month.  Again encouraged him to further moderate his alcohol usage.  He is using alcohol to self medicate anxiety. Option Abilify potentiation to speed recovery.   Option retry  bc he didn't give it a chance.   Yes Abilify 5 mg daily. Buspirone 30 mg HS for sleep and anxiety.  He reports in the past that he took buspirone and made him sleepy and he stopped it for that reason but wonders if it might help his sleep now.  It could also potentially help anxiety. wean lithium DT NR.    As of appointment February 24, 2020 the following is reported: depression and anxiety are both better.  Starts day feeling good and positive with energy but around noon gets more tired and negative and depressed as day progresses.  Pattern even before the meds. Plan: Split clomipramine dosage in order to reduce side effects with a total dose of 200 mg daily. Potentiate with Abilify 10 mg daily. Check clomipramine level.   03/30/2020 phone call patient complaining of a lot of depression and anxiety for the last 3 weeks and he thought it was related to the Abilify increase.  Since the Abilify had not been helpful it was discontinued. 04/13/2020 phone call asking for increase in lorazepam for anxiety.  04/23/2020 appointment with the following noted: More depressed and more  anxiety now than last time starting about a month ago and no difference off Abilify. Sleep a lot better and thinks buspirone helps it. No SE clomipramine. Anxiety mainly at work and starts thinking about worry over the future.  Expect the worse. Plan: Clomipramine level 02/24/20 is 225 on 200 mg daily. Increase clomipramine to 250 mg daily. Risperidone augmentation 1-2 mg HS.  06/10/20 appt with the following noted: No benefit but gaining weight and night sweats. No sig change in anxiety or depression.  No panic.   Just started new job so social anxiety.  And worry about the new job bc had old one for 7 years.  Making more money and still not happy. Sadness and hopeless ness.  Going through motions of life.  No joy. Depression worse than anxiety. PE recently was good. Pt reports that mood is worse later in the day as noted  depressed moderate 6/10 and unchanged.   Anxious and describes anxiety as 4-5/10 which is better.Marland Kitchen Anxiety symptoms include: Excessive Worry, Obsessive Compulsive Symptoms:   as noted last appt,. Startled easily is not new but maybe worse. Sleep good but night sweats  No NM since the meds.  Did have before the meds.  Pt reports that appetite is good. Pt reports that energy is down significantly and no change and good. Concentration is down slightly. Suicidal thoughts:  denied by patient.    No panic since here. OCD is better.  Would take 10 min to leave house and now can leave in 2 mins with much less anxiety and easier to let it go. He had worsening insomnia with clomipramine  HS.   Plan: Start duloxetine 1 daily and reduce clomipramine to 3 daily for 5 days, Then increase duloxetine to 2 daily and reduce clomipramine to 2 daily for 5 days, then increase duloxetine to 3 daily and continue clomipramine 2 daily Reduce risperidone to 1 nightly for 10 days then stop it  08/12/2020 appointment with the following noted: Took meds until it ran out and then didn't get it refilled  re: stopping duloxetine. Stayed on clomipramine 100 mg AM.  Both OCD and depression are worse.  No motivation.  Not paying bills despite having money to pay them.  House is a mess.  Fleeting SI without intent/plan.  He wants to stop buspirone bc doesn't  thing it does anything. Frustrated with how long this is taking. NV with stress in the morning on days he has to go to work.   Work 4 days a week and drinks only 2-3 beers on off days maybe 6-7 beers. No change since last here.  Case and a half weekly.  Does not drive after alcohol.    Asks for prn relief med not everyday for severe anxiety.    Past Psychiatric Medication Trials: Zoloft SE, Low dose Prozac for a year NR alprazolam irritable, lorazepam, Trazodone and mirtazapine bad dreams and tried 3-4 nights. Quetiapine 25 bad dreams, Unisom lost response  Wellbutrin rash Abilify some benefit but insomnia  Lithium 600 NR  Buspirone sleepy History of Wilber Bihari.  Not seen in 3-4 mos History of Andy Mitchum.   Review of Systems:  Review of Systems  Gastrointestinal: Positive for nausea and vomiting. Negative for diarrhea.  Neurological: Positive for dizziness. Negative for tremors and weakness.  Psychiatric/Behavioral: Positive for depression and dysphoric mood. Negative for agitation, confusion, decreased concentration and suicidal ideas. The patient is nervous/anxious.     Medications: I have reviewed the patient's current medications.  Current Outpatient Medications  Medication Sig Dispense Refill  . busPIRone (BUSPAR) 30 MG tablet TAKE 1 TABLET BY MOUTH AT BEDTIME. 90 tablet 0  . clomiPRAMINE (ANAFRANIL) 50 MG capsule TAKE 2 CAPSULES BY MOUTH IN THE MORNING AND 3 CAPSULES AT NIGHT (Patient taking differently: TAKE 2 CAPSULES BY MOUTH IN THE MORNING) 450 capsule 0  . LORazepam (ATIVAN) 1 MG tablet TAKE 1 TABLET BY MOUTH EVERY 8 HOURS AS NEEDED FOR ANXIETY 30 tablet 0  . Multiple Vitamin (ONE-A-DAY MENS PO) Take by mouth.     . triamterene-hydrochlorothiazide (DYAZIDE) 37.5-25 MG per capsule   4  . DULoxetine (CYMBALTA) 30 MG capsule 1 DAILY FOR 5 DAYS, THEN 2 DAILY FOR 5 DAYS, THEN 3 DAILY (Patient not taking: Reported on 08/12/2020) 270 capsule 0   No current facility-administered medications for this visit.    Medication Side Effects" : insomnia, low sex drive and weight gain, mild sweating, dry mouth  Allergies:  Allergies  Allergen Reactions  . Chantix [Varenicline] Other (See Comments)    intolerant  . Penicillins     REACTION: RASH (any "cillins" meds  . Wellbutrin [Bupropion] Hives    Unsure if this was the cause     Past Medical History:  Diagnosis Date  . 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 Meniere syndrome   . Substance abuse ([place])    alcohol abuse, active    Family History  Problem Relation Age of Onset  . Depression Mother   . Obesity Mother   . Healthy Sister     Social History   Socioeconomic History  . Marital status: Single    Spouse name: Not on file  . Number of children: Not on file  . Years of education: Not on file  . Highest education level: Not on file  Occupational History  . Occupation: Furniture conservator/restorer  Tobacco Use  . Smoking status: Current Every Day Smoker    Packs/day: 1.00    Years: 15.00    Pack years: 15.00    Types: Cigarettes  . Smokeless tobacco: Never Used  Substance and Sexual Activity  . Alcohol use: Yes    Alcohol/week: 168.0 standard drinks    Types: 168 Cans of beer per week    Comment: drinks a case of beer  daily  . Drug use: Not Currently  . Sexual activity: Not Currently    Birth control/protection: Condom  Other Topics Concern  . Not on file  Social History Narrative  . Not on file   Social Determinants of Health   Financial Resource Strain:   . Difficulty of Paying Living Expenses: Not on file  Food Insecurity:   . Worried About Charity fundraiser in the Last Year: Not on file  . Ran Out of Food in the Last Year: Not  on file  Transportation Needs:   . Lack of Transportation (Medical): Not on file  . Lack of Transportation (Non-Medical): Not on file  Physical Activity:   . Days of Exercise per Week: Not on file  . Minutes of Exercise per Session: Not on file  Stress:   . Feeling of Stress : Not on file  Social Connections:   . Frequency of Communication with Friends and Family: Not on file  . Frequency of Social Gatherings with Friends and Family: Not on file  . Attends Religious Services: Not on file  . Active Member of Clubs or Organizations: Not on file  . Attends Archivist Meetings: Not on file  . Marital Status: Not on file  Intimate Partner Violence:   . Fear of Current or Ex-Partner: Not on file  . Emotionally Abused: Not on file  . Physically Abused: Not on file  . Sexually Abused: Not on file    Past Medical History, Surgical history, Social history, and Family history were reviewed and updated as appropriate.   Please see review of systems for further details on the patient's review from today.   Objective:   Physical Exam:  There were no vitals taken for this visit.  Physical Exam Constitutional:      General: He is not in acute distress. Musculoskeletal:        General: No deformity.  Neurological:     Mental Status: He is alert and oriented to person, place, and time.     Coordination: Coordination normal.  Psychiatric:        Attention and Perception: Attention and perception normal. He does not perceive auditory or visual hallucinations.        Mood and Affect: Mood is anxious and depressed. Affect is not labile, blunt, angry or inappropriate.        Speech: Speech normal.        Behavior: Behavior normal.        Thought Content: Thought content normal. Thought content is not paranoid or delusional. Thought content does not include homicidal or suicidal ideation. Thought content does not include homicidal or suicidal plan.        Cognition and Memory:  Cognition and memory normal.        Judgment: Judgment is impulsive.     Comments: Insight fair. Frustrated with length of time to get tx response     Lab Review:     Component Value Date/Time   NA 138 09/16/2019 0900   K 4.1 09/16/2019 0900   CL 98 09/16/2019 0900   CO2 31 09/16/2019 0900   GLUCOSE 96 09/16/2019 0900   BUN 21 09/16/2019 0900   CREATININE 0.89 09/16/2019 0900   CALCIUM 9.8 09/16/2019 0900   PROT 6.8 09/16/2019 0900   ALBUMIN 4.6 09/16/2019 0900   AST 19 09/16/2019 0900   ALT 29 09/16/2019 0900   ALKPHOS 102 09/16/2019 0900   BILITOT 0.4 09/16/2019 0900   GFRNONAA  98 01/30/2008 1430   GFRAA 119 01/30/2008 1430       Component Value Date/Time   WBC 15.7 (H) 09/16/2019 0900   RBC 5.10 09/16/2019 0900   HGB 16.5 09/16/2019 0900   HCT 48.1 09/16/2019 0900   PLT 244.0 09/16/2019 0900   MCV 94.3 09/16/2019 0900   MCHC 34.2 09/16/2019 0900   RDW 13.2 09/16/2019 0900   LYMPHSABS 2.5 09/16/2019 0900   MONOABS 1.0 09/16/2019 0900   EOSABS 0.2 09/16/2019 0900   BASOSABS 0.1 09/16/2019 0900    No results found for: POCLITH, LITHIUM   No results found for: PHENYTOIN, PHENOBARB, VALPROATE, CBMZ   September 30, 2019 clomipramine  was 92 on a dose of 75 mg daily.  02/24/2020 clomipramine level 225 on 200 mg daily.  Genesight testing   .res Assessment: Plan:    Dorian was seen today for follow-up, depression, anxiety and sleeping problem.  Diagnoses and all orders for this visit:  Major depressive disorder, recurrent episode, moderate (HCC)  Mixed obsessional thoughts and acts  Generalized anxiety disorder  Social anxiety disorder  Insomnia due to mental condition  Alcohol abuse    Discussed his ongoing problems with depression, anxiety, OCD, and treatment resistant insomnia as well.  Primary intrusive thoughts are sexual.  Can obsess over something back from 20 years ago. The OCD was partially responsive to the clomipramine.  As noted he has had  side effects with various things of that have been tried.    Unfortunately did not stick with the plan and discontinued duloxetine on his own because he did not see improvement after a brief period of time.  It was explained that this was not an adequate trial and that he not should not stop meds on his own because now he feels worse on the lower dose of clomipramine 100 mg daily.  He is somewhat irritable and frustrated at not getting the degree of improvement that he would like.  Again the treatment plan was explained to him that it takes several weeks for these types of medications to work assuming the dosage and the medication are both optimal and it is trial and error to achieve that.  He was encouraged to be patient did not change meds on his own which will only sabotage his treatment.  Discussed various options including returning to duloxetine and increasing the dose to 220 mg daily, increasing clomipramine back up and using adjunctive therapy such as Abilify at a higher dose or Rexulti, or switching to a different SSRI or SNRI.  We discussed various options in detail as well as potential side effects.  He will preferred to try Effexor which we will start at 75 mg and increase to 225 mg.  He may need 300 mg.  To speed up recovery we will augment with Rexulti 1 mg daily for a week and then increase to 2 mg daily and he will use samples for that..  Reverse diurnal pattern of sx.  Discussed higher than usual risk of problems with SSRIs due to the GeneSight genetics noted.    Discussed potential metabolic side effects associated with atypical antipsychotics, as well as potential risk for movement side effects. Advised pt to contact office if movement side effects occur.    Residual sx of depression and anxiety.    No Ativan with alcohol.  Ok a few prn severe panic/anxiety #12/month.  Again encouraged him to further moderate his alcohol usage.  He is using alcohol to self medicate anxiety.  Rec  trial Rexulti bc history partial response to Abilify 1 then 2 mg daily.  Option retry lithium at higher dose with clomipramine.  Vraylar or Rexulti off label Option Trintellix.  Reduce alsohol and consider Campral or naltrexone which might help OCD ioff label.  He doesn't think buspirone is helping anymore and wants to stop it.  OK.  Disc risk worsening.  Insomnia is a chronic problem.  Concerns about using BZ bc of alcohol.  Failed several sleep meds.  Recommend counseling with OCD therapist. Emphasized the importance of ERP.   6-7 weeks  Please see After Visit Summary for patient specific instructions.    Lynder Parents, MD, DFAPA   Future Appointments  Date Time Provider [place]  09/16/2020  7:55 AM [facility]  09/22/2020  9:00 AM Tower, Wynelle Fanny, MD [facility]    No orders of the defined types were placed in this encounter.   -------------------------------

## 2020-08-17 ENCOUNTER — Telehealth: Payer: Self-pay | Admitting: Psychiatry

## 2020-08-17 ENCOUNTER — Other Ambulatory Visit: Payer: Self-pay | Admitting: Psychiatry

## 2020-08-17 DIAGNOSIS — F422 Mixed obsessional thoughts and acts: Secondary | ICD-10-CM

## 2020-08-17 NOTE — Telephone Encounter (Signed)
Please clarify.   Not seeing he was to discontinue Clomipramine.

## 2020-08-17 NOTE — Telephone Encounter (Signed)
Pt called and said that he has forgotten whether he was told to stop Clomipramine. Pt also wants to know why his rx for Ativan was decreased. Please call.

## 2020-08-17 NOTE — Telephone Encounter (Signed)
Stay on clomipramine current dose until the new meds works.  Not sure how the ativan strength was reduced.  He can take 2 at a time if needed but take the lowest dose that works.   When refill is due will change back to 1 mg size instead of 0.5 mg size.

## 2020-08-18 NOTE — Telephone Encounter (Signed)
Lance Foley please inform him of Dr. Casimiro Needle message

## 2020-08-19 NOTE — Telephone Encounter (Signed)
Patient aware and will call back when running low on his medication.

## 2020-08-25 ENCOUNTER — Telehealth: Payer: Self-pay | Admitting: Psychiatry

## 2020-08-25 NOTE — Telephone Encounter (Signed)
Pt called to ask if he should take Venlafaxine XR in the morning or night. Having issue staying awake now and reads may cause drowsiness. Please advise today if possible so he can start this med.  Contact @ [phone]

## 2020-08-26 ENCOUNTER — Telehealth: Payer: Self-pay | Admitting: Psychiatry

## 2020-08-26 NOTE — Telephone Encounter (Signed)
Pt called checking status on if he should start taking Venlafaxine XR 75 mg in the morning or night.  Contact @ [phone].  Pt was advised to take at night per nurse.

## 2020-08-26 NOTE — Telephone Encounter (Signed)
It can be taken morning or night whatever he prefers

## 2020-08-26 NOTE — Telephone Encounter (Signed)
Reviewed thank you Tammy

## 2020-08-26 NOTE — Telephone Encounter (Signed)
FYI, Patient called back again advised him to try his Venlafaxine at night due to side effect of drowsiness. If further questions or concerns to call back.

## 2020-08-26 NOTE — Telephone Encounter (Signed)
Patient has not started taking the Venlafaxine yet. Please review

## 2020-09-13 ENCOUNTER — Telehealth: Payer: Self-pay | Admitting: Family Medicine

## 2020-09-13 DIAGNOSIS — E7800 Pure hypercholesterolemia, unspecified: Secondary | ICD-10-CM

## 2020-09-13 DIAGNOSIS — R7309 Other abnormal glucose: Secondary | ICD-10-CM

## 2020-09-13 DIAGNOSIS — Z0000 Encounter for general adult medical examination without abnormal findings: Secondary | ICD-10-CM

## 2020-09-13 DIAGNOSIS — R7303 Prediabetes: Secondary | ICD-10-CM

## 2020-09-13 DIAGNOSIS — R7401 Elevation of levels of liver transaminase levels: Secondary | ICD-10-CM

## 2020-09-13 NOTE — Telephone Encounter (Signed)
-----   Message from Cloyd Stagers, RT sent at 09/01/2020  1:28 PM EDT ----- Regarding: Lab Orders for Monday 11.8.2021 Please place lab orders for Monday 11.8.2021, office visit for physical on Tuesday 11.16.2021 Thank you, Dyke Maes RT(R)

## 2020-09-14 ENCOUNTER — Other Ambulatory Visit (INDEPENDENT_AMBULATORY_CARE_PROVIDER_SITE_OTHER): Payer: Managed Care, Other (non HMO)

## 2020-09-14 ENCOUNTER — Other Ambulatory Visit: Payer: Self-pay

## 2020-09-14 DIAGNOSIS — R7401 Elevation of levels of liver transaminase levels: Secondary | ICD-10-CM

## 2020-09-14 DIAGNOSIS — Z0000 Encounter for general adult medical examination without abnormal findings: Secondary | ICD-10-CM | POA: Diagnosis not present

## 2020-09-14 DIAGNOSIS — R7303 Prediabetes: Secondary | ICD-10-CM

## 2020-09-14 DIAGNOSIS — E7800 Pure hypercholesterolemia, unspecified: Secondary | ICD-10-CM

## 2020-09-14 LAB — CBC WITH DIFFERENTIAL/PLATELET
Basophils Absolute: 0.1 10*3/uL (ref 0.0–0.1)
Basophils Relative: 0.7 % (ref 0.0–3.0)
Eosinophils Absolute: 0.4 10*3/uL (ref 0.0–0.7)
Eosinophils Relative: 2.6 % (ref 0.0–5.0)
HCT: 45.9 % (ref 39.0–52.0)
Hemoglobin: 15.5 g/dL (ref 13.0–17.0)
Lymphocytes Relative: 18 % (ref 12.0–46.0)
Lymphs Abs: 2.5 10*3/uL (ref 0.7–4.0)
MCHC: 33.7 g/dL (ref 30.0–36.0)
MCV: 102.3 fl — ABNORMAL HIGH (ref 78.0–100.0)
Monocytes Absolute: 0.9 10*3/uL (ref 0.1–1.0)
Monocytes Relative: 6.4 % (ref 3.0–12.0)
Neutro Abs: 10.2 10*3/uL — ABNORMAL HIGH (ref 1.4–7.7)
Neutrophils Relative %: 72.3 % (ref 43.0–77.0)
Platelets: 253 10*3/uL (ref 150.0–400.0)
RBC: 4.48 Mil/uL (ref 4.22–5.81)
RDW: 14.5 % (ref 11.5–15.5)
WBC: 14.1 10*3/uL — ABNORMAL HIGH (ref 4.0–10.5)

## 2020-09-14 LAB — COMPREHENSIVE METABOLIC PANEL
ALT: 108 U/L — ABNORMAL HIGH (ref 0–53)
AST: 206 U/L — ABNORMAL HIGH (ref 0–37)
Albumin: 4.1 g/dL (ref 3.5–5.2)
Alkaline Phosphatase: 157 U/L — ABNORMAL HIGH (ref 39–117)
BUN: 7 mg/dL (ref 6–23)
CO2: 31 mEq/L (ref 19–32)
Calcium: 9.3 mg/dL (ref 8.4–10.5)
Chloride: 98 mEq/L (ref 96–112)
Creatinine, Ser: 0.77 mg/dL (ref 0.40–1.50)
GFR: 115.25 mL/min (ref >60.00)
Glucose, Bld: 126 mg/dL — ABNORMAL HIGH (ref 70–99)
Potassium: 3.6 mEq/L (ref 3.5–5.1)
Sodium: 139 mEq/L (ref 135–145)
Total Bilirubin: 1.3 mg/dL — ABNORMAL HIGH (ref 0.2–1.2)
Total Protein: 6.5 g/dL (ref 6.0–8.3)

## 2020-09-14 LAB — TSH: TSH: 1.69 u[IU]/mL (ref 0.35–4.50)

## 2020-09-14 LAB — HEMOGLOBIN A1C: Hgb A1c MFr Bld: 7.1 % — ABNORMAL HIGH (ref 4.6–6.5)

## 2020-09-14 LAB — LIPID PANEL
Cholesterol: 407 mg/dL — ABNORMAL HIGH (ref 0–200)
HDL: 35.7 mg/dL — ABNORMAL LOW (ref >39.00)
Total CHOL/HDL Ratio: 11
Triglycerides: 441 mg/dL — ABNORMAL HIGH (ref 0.0–149.0)

## 2020-09-14 LAB — LDL CHOLESTEROL, DIRECT: Direct LDL: 354 mg/dL

## 2020-09-15 ENCOUNTER — Other Ambulatory Visit: Payer: Self-pay | Admitting: Psychiatry

## 2020-09-15 DIAGNOSIS — F331 Major depressive disorder, recurrent, moderate: Secondary | ICD-10-CM

## 2020-09-15 DIAGNOSIS — F4010 Social phobia, unspecified: Secondary | ICD-10-CM

## 2020-09-15 DIAGNOSIS — F411 Generalized anxiety disorder: Secondary | ICD-10-CM

## 2020-09-15 DIAGNOSIS — F422 Mixed obsessional thoughts and acts: Secondary | ICD-10-CM

## 2020-09-16 ENCOUNTER — Other Ambulatory Visit: Payer: Managed Care, Other (non HMO)

## 2020-09-20 IMAGING — DX DG CHEST 2V
2 series · 2 of 2 positions shown · non-contrast
Comparison: 03/31/2004

CLINICAL DATA: Left lateral chest wall pain

EXAM:
CHEST - 2 VIEW

[chest pa]
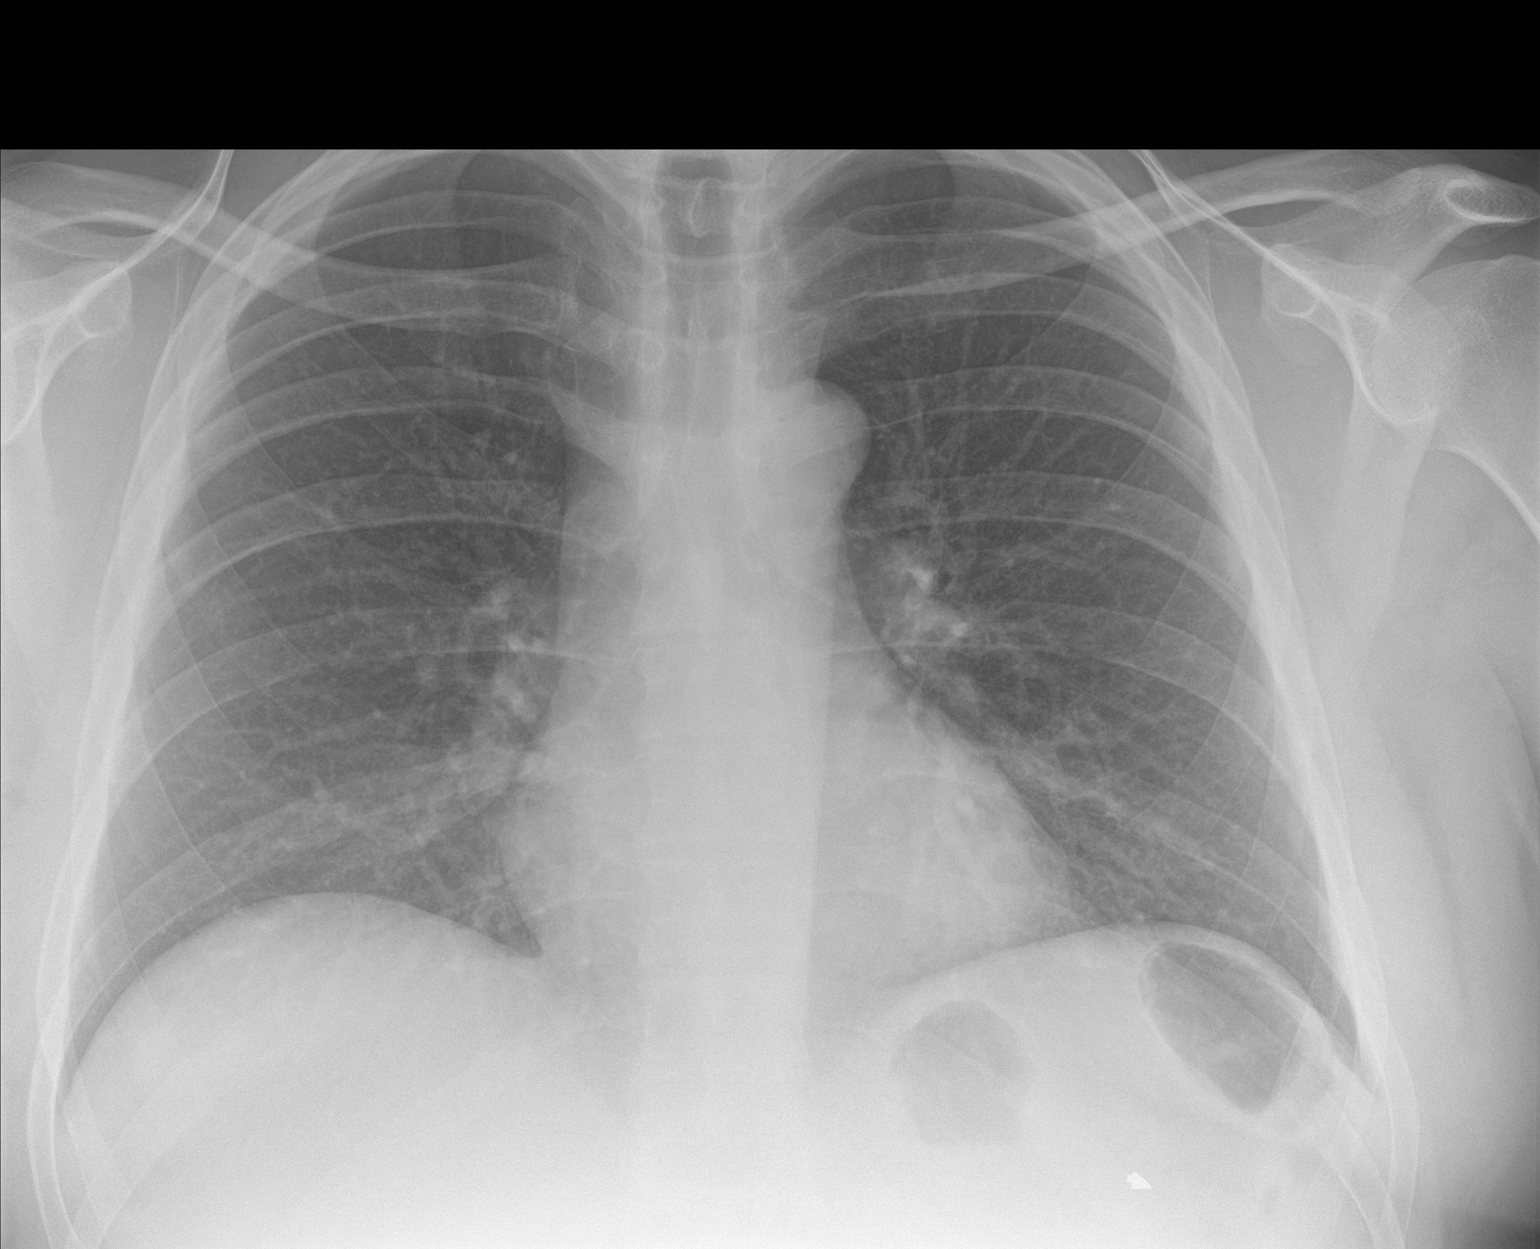

[chest lat]
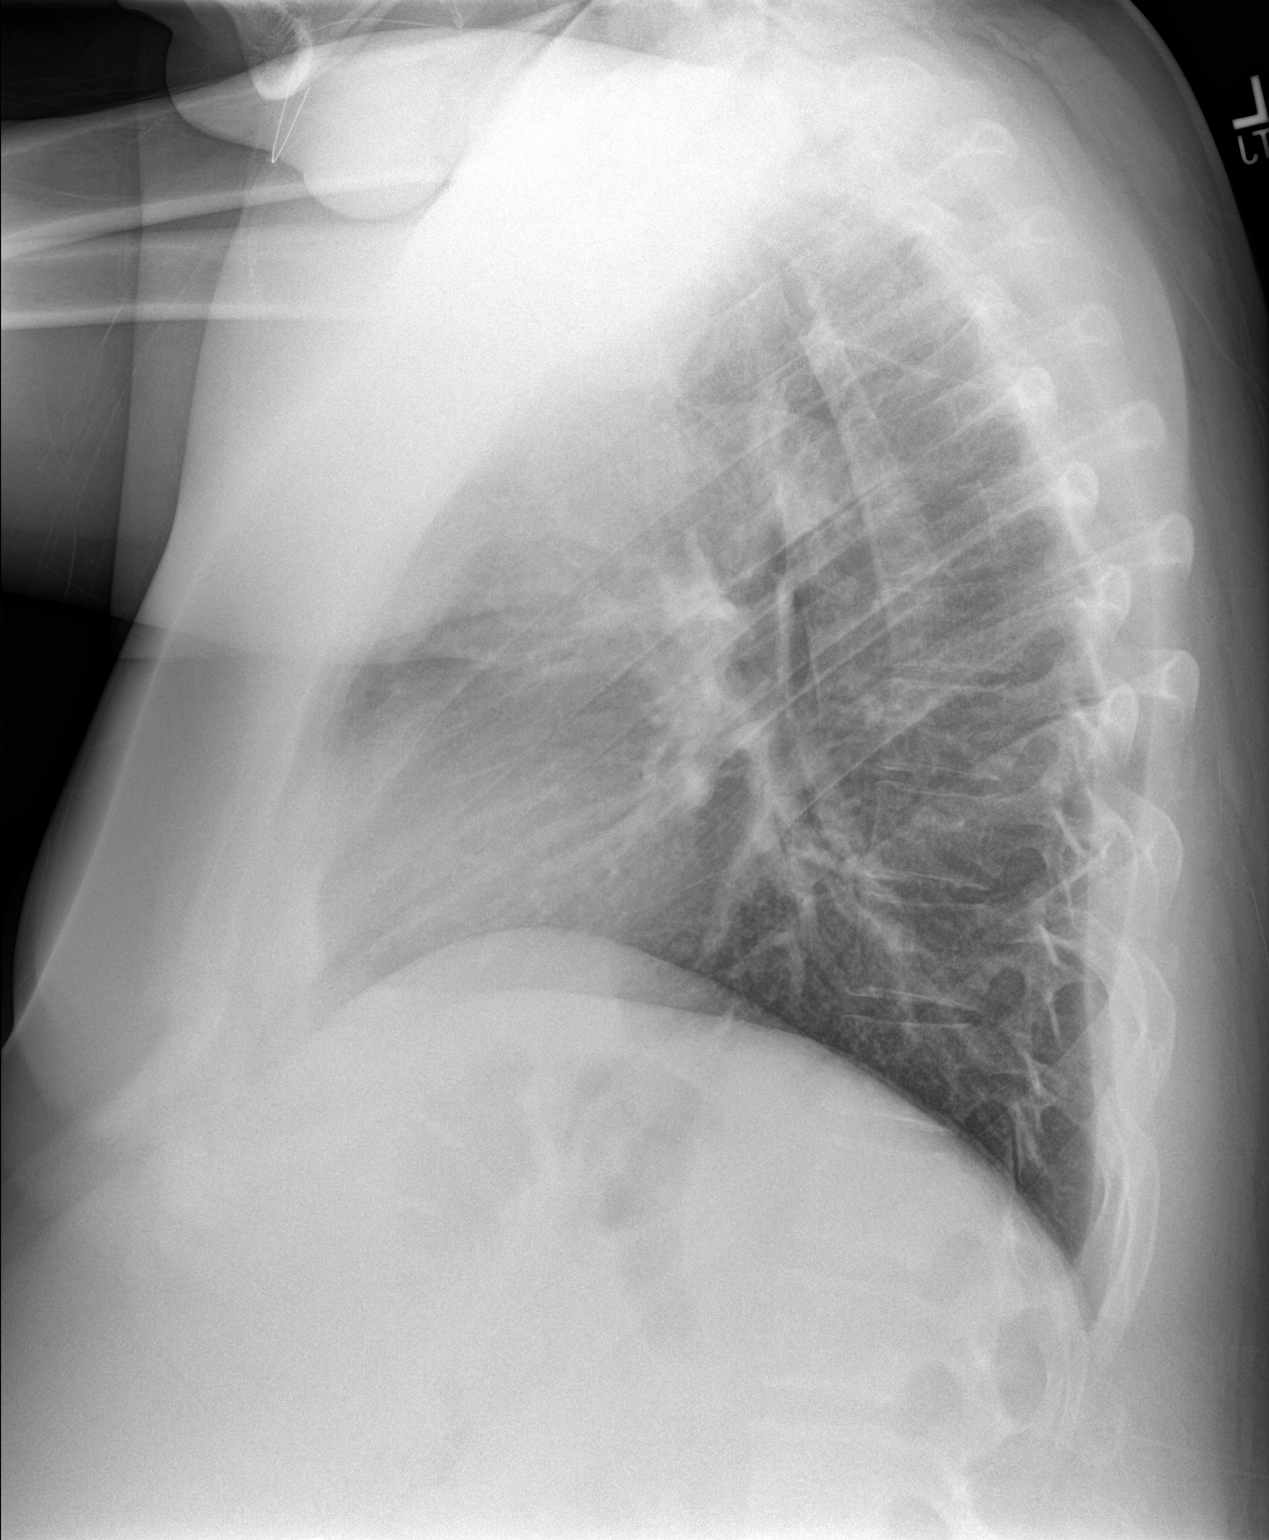

[2 of 2 positions shown; findings below may reference images not displayed]

FINDINGS: The heart size and mediastinal contours are within normal limits.
Both lungs are clear. The visualized skeletal structures are
unremarkable.
IMPRESSION: Negative.

## 2020-09-22 ENCOUNTER — Encounter: Payer: Self-pay | Admitting: Family Medicine

## 2020-09-22 ENCOUNTER — Other Ambulatory Visit: Payer: Self-pay

## 2020-09-22 ENCOUNTER — Ambulatory Visit (INDEPENDENT_AMBULATORY_CARE_PROVIDER_SITE_OTHER): Payer: Managed Care, Other (non HMO) | Admitting: Family Medicine

## 2020-09-22 VITALS — BP 150/96 | HR 135 | Temp 96.9°F | Ht 69.0 in | Wt 224.6 lb

## 2020-09-22 DIAGNOSIS — K429 Umbilical hernia without obstruction or gangrene: Secondary | ICD-10-CM

## 2020-09-22 DIAGNOSIS — R7309 Other abnormal glucose: Secondary | ICD-10-CM

## 2020-09-22 DIAGNOSIS — F17200 Nicotine dependence, unspecified, uncomplicated: Secondary | ICD-10-CM

## 2020-09-22 DIAGNOSIS — D72825 Bandemia: Secondary | ICD-10-CM

## 2020-09-22 DIAGNOSIS — R7303 Prediabetes: Secondary | ICD-10-CM | POA: Diagnosis not present

## 2020-09-22 DIAGNOSIS — F418 Other specified anxiety disorders: Secondary | ICD-10-CM

## 2020-09-22 DIAGNOSIS — E7800 Pure hypercholesterolemia, unspecified: Secondary | ICD-10-CM

## 2020-09-22 DIAGNOSIS — E669 Obesity, unspecified: Secondary | ICD-10-CM

## 2020-09-22 DIAGNOSIS — F1010 Alcohol abuse, uncomplicated: Secondary | ICD-10-CM | POA: Diagnosis not present

## 2020-09-22 DIAGNOSIS — R7401 Elevation of levels of liver transaminase levels: Secondary | ICD-10-CM

## 2020-09-22 DIAGNOSIS — Z0001 Encounter for general adult medical examination with abnormal findings: Secondary | ICD-10-CM

## 2020-09-22 DIAGNOSIS — R030 Elevated blood-pressure reading, without diagnosis of hypertension: Secondary | ICD-10-CM

## 2020-09-22 NOTE — Assessment & Plan Note (Signed)
BP: (!) 150/96    Suspect in part from alcohol abuse (also rapid pulse)  Pt voiced understanding  Disc use of beta blocker to regulate bp and pulse and to reduce risk of portal HTN related esoph varices  Pt declines

## 2020-09-22 NOTE — Assessment & Plan Note (Signed)
A1C is now in the diabetic range This may be in part due to heavy alcohol consumption and wt gain  He is not interested in treatment at this time
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
## 2020-09-22 NOTE — Assessment & Plan Note (Signed)
This has grown and is causing pain  Pt req gen surg referral to consider surgery  Ref done  Did note that surgery would confer some risks given current heavy etoh use

## 2020-09-22 NOTE — Assessment & Plan Note (Signed)
Now, liver enzymes are more elevated- at risk for alcoholic hepatitis and further liver dysfunction  Disc this in detail with pt along with risk of esoph varicies/ clotting problems  Does have elevated bp and pulse currently  Also elevated wbc and MCV and glucose  Pt has done rehab and IOP in the past and declines these  Does not feel ready to quit or seek treatment despite the multiple physical problems he is having  Encouraged him to at least attend an [place] meeting  He voiced understanding of risks of continued drinking and will alert is if/when he is ready to seek treatment

## 2020-09-22 NOTE — Assessment & Plan Note (Signed)
.  Discussed how this problem influences overall health and the risks it imposes  Reviewed plan for weight loss with lower calorie diet (via better food choices and also portion control or program like weight watchers) and exercise building up to or more than 30 minutes 5 days per week including some aerobic activity   Enc to consider etoh cessation  Also noted- A1c is now in the diabetic range

## 2020-09-22 NOTE — Patient Instructions (Addendum)
Continue the multi vitamin  Start thinking about quitting drinking  Think about an AA meeting to get you motivated  You need to be on blood pressure medicine to reduce the risk of GI bleeding  (for bp and pulse)  Let us know if/when you want to start that   If you re think any immunizations let us know   I placed a general surgery referral- you will get a call   You are in the diabetic range now - diet and quitting drinking are important   When you are ready to begin getting healthier please let us know

## 2020-09-22 NOTE — Assessment & Plan Note (Signed)
Worrisome in the setting of alcohol abuse and enl hepatic border on exam  He is not interested in tx for alcoholism / cessation at this time  Disc ramifications of alcoholic liver dz and risk of alcoholic hepatitis

## 2020-09-22 NOTE — Assessment & Plan Note (Signed)
Mood has been stable under care of Dr Clovis Pu  Disc the risks of his alcohol use and urged him to discuss with his psychiatrist

## 2020-09-22 NOTE — Assessment & Plan Note (Signed)
Disc in detail risks of smoking and possible outcomes including copd, vascular/ heart disease, cancer , respiratory and sinus infections  Pt voices understanding  He is not ready to quit currently

## 2020-09-22 NOTE — Assessment & Plan Note (Signed)
Reviewed health habits including diet and exercise and skin cancer prevention Reviewed appropriate screening tests for age  Also reviewed health mt list, fam hx and immunization status , as well as social and family history   Pt declines immunizations for flu and pna and covid vaccines  Enc smoking cessation  Strongly enc alcohol cessation (this is starting to interfere with health)  No prostate problems  Diet is not balanced  Depression- fairly good control under psychiatric care  Suspect alcoholic liver dz based on labs and A1C is now in the diabetic range  Also elevated bp and pulse  Pt is not interested in tx for these problems currently

## 2020-09-22 NOTE — Assessment & Plan Note (Signed)
LDL is extremely high at 202 Disc goals for lipids and reasons to control them Rev last labs with pt Rev low sat fat diet in detail Cannot start statin at this time in light of heavy alcohol use and elevated liver tests

## 2020-09-22 NOTE — Assessment & Plan Note (Signed)
Lab Results  Component Value Date   HGBA1C 7.1 (H) 09/14/2020   Now in the dm range  Urged to work on etoh cessation  disc imp of low glycemic diet and wt loss to prevent DM2  He declines treatment for this currently

## 2020-09-22 NOTE — Assessment & Plan Note (Signed)
Suspect due to smoking  He is not ready to quit

## 2020-09-23 ENCOUNTER — Ambulatory Visit: Payer: Self-pay | Admitting: Podiatry

## 2020-09-30 ENCOUNTER — Ambulatory Visit (INDEPENDENT_AMBULATORY_CARE_PROVIDER_SITE_OTHER): Payer: 59 | Admitting: Psychiatry

## 2020-09-30 ENCOUNTER — Other Ambulatory Visit: Payer: Self-pay

## 2020-09-30 ENCOUNTER — Encounter: Payer: Self-pay | Admitting: Psychiatry

## 2020-09-30 DIAGNOSIS — F4010 Social phobia, unspecified: Secondary | ICD-10-CM | POA: Diagnosis not present

## 2020-09-30 DIAGNOSIS — F331 Major depressive disorder, recurrent, moderate: Secondary | ICD-10-CM | POA: Diagnosis not present

## 2020-09-30 DIAGNOSIS — F422 Mixed obsessional thoughts and acts: Secondary | ICD-10-CM

## 2020-09-30 DIAGNOSIS — F1024 Alcohol dependence with alcohol-induced mood disorder: Secondary | ICD-10-CM

## 2020-09-30 DIAGNOSIS — F5105 Insomnia due to other mental disorder: Secondary | ICD-10-CM

## 2020-09-30 DIAGNOSIS — F411 Generalized anxiety disorder: Secondary | ICD-10-CM | POA: Diagnosis not present

## 2020-09-30 NOTE — Patient Instructions (Signed)
[facility]  [facility] Address: [address], [place], [place] [address], [place]  Phone: [phone]

## 2020-09-30 NOTE — Progress Notes (Addendum)
VALERIAN JEWEL [id number] 1986/06/19 34 y.o.    Subjective:   Patient ID:  Lance Foley is a 34 y.o. (DOB [birth year]) male.  Chief Complaint:  Chief Complaint  Patient presents with  . Follow-up  . Anxiety  . Depression  . Medication Problem    Depression        Associated symptoms include fatigue.  Associated symptoms include no decreased concentration and no suicidal ideas.  [name] presents to the office today for follow-up of major depression, OCD, generalized anxiety disorder, social anxiety disorder, and alcohol abuse.  Originally referrred by Lendell Caprice.   He was initially seen on June 26, 2019.  He had a high degree of anxiety over taking medications.  In fact he was fearful of medications.  Therefore he wanted to pursue GeneSight testing versus starting medications that were discussed with him.    On August 31 GeneSight testing was discussed with him in detail.  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   Based on these genetics it was suggested he might have a better response to this clomipramine versus standard SSRIs and that was recommended.  He wanted to research some more and discuss it at this appointment.  He was seen August 05, 2019.  Because of the genetic variations noted it was suggested that he try clomipramine first rather than go with a standard SSRI based on the genetics.  He wanted to defer starting that.  He called back October 8 stating he had tried Xanax for anxiety but it made him irritable and wanted to try an alternative so he was prescribed lorazepam 0.5 mg tablets 1-2 twice daily as needed.  He was cautioned not to take this with alcohol.  seen September 30, 2019.  On clomipramine 75 mg HS for 3 weeks.  Early SE resolved except tiredness at times but other times bursts of energy from nowhere. Poor after 3  hours of good sleep.  Restless sleep thereafter.   Is sleepy daytime at times.  No one else notices him as hyper.  Ordered serum clomipramine level at that time which was 92 on a dose of 75 mg daily.  On December 1 he complained of not sleeping well with trazodone having nightmares.  That was switched to mirtazapine 7.5 mg nightly. In addition his low clomipramine level was discussed and it was recommended the increase to 100 mg daily and move it away from bedtime if possible to see if that was contributing to the insomnia.  He called back on December 14 wanting to increase clomipramine again because of not seeing benefit.  It is clearly not been long enough to see full benefit however based on the blood level it was reasonable to increase to 150 mg daily if he could tolerate it so he was given permission to do so.On 125 mg clomipramine for less than 3 weeks and can't tell much difference.  Doesn't feel it's helping as much as he'd like for depression and OCD but GAD is better with it.  seen November 12, 2019.  Mirtazapine was changed to hydroxyzine 25-50 nightly for sleep.  Abilify was added to potentiate the antidepressant.  Clomipramine was continued at 125 mg daily. He called pack complaining of poor sleep on January 19 and hydroxyzine was stopped and quetiapine 25 mg 1-2 nightly was prescribed for treatment resistant insomnia. Took Abilify 5  mg daily for 3 days and felt more depressed and stopped it.  No other SE.  Didn't see the point to taking it longer. Bad experience with Seroquel with vivid dreams and didn't sleep really more.  Struck out in his sleep and fell out of bed.  Never acted out dreams before. Some difference in OCD with clomipramine but not better with anxiety and depression.  Less checking and leaves house quicker markedly.  Anxiety builds in afternoon with more irritable and negative ruminations and depressive thoughts.  Better in the morning. Sleep bad with 3 hours and toss and  turn for hours thereafter.  Ativan 1 mg no longer helps.  No problems with sleep until clomipramine.   Don't feel tired daytime. Asks about Buspar which made him sleepy in the past. Less worry and anxiety with the clomipramine but no improvement in OCD yet.  Struggles with motivation and depression and OCD and not sure which is worse.  seen February 2.  Because of symptoms ongoing as noted the following change was made: Increase clomipramine to 150 mg daily. Added lithium for potentiation.  seen January 21, 2020.  The following was noted: OCD better but not resolved with clomipramine and Buspar plus melatonin helps sleep.  Still depressed.  Anxiety and depressive symptoms were rare related is moderate. Broke up with GF of 3 years about 6 weeks ago and it's been hard.  Not accepted until a week ago.  Feels his lack of sexual interest was a problem.  This was a problem with her before the meds.  Had lost interest in her sexually before the meds. The following plan was made:Discussed higher than usual risk of problems with SSRIs due to the GeneSight genetics noted.  Discussed side effects of clomipramine.  He is having some side effects as noted above.  However increasing the dose could further reduce his anxiety and depression as well as OCD. Clomipramine level 92 at 75 mg .  Should be more solid in normal range at 125 mg with potential to increase to 150 mg.  Some insomnia with it. Taking it in AM now   Increase clomipramine to 200 mg daily.   Then check level again. No Ativan with alcohol.  Ok a few prn severe panic/anxiety #12/month.  Again encouraged him to further moderate his alcohol usage.  He is using alcohol to self medicate anxiety. Option Abilify potentiation to speed recovery.   Option retry bc he didn't give it a chance.   Yes Abilify 5 mg daily. Buspirone 30 mg HS for sleep and anxiety.  He reports in the past that he took buspirone and made him sleepy and he stopped it for that reason  but wonders if it might help his sleep now.  It could also potentially help anxiety. wean lithium DT NR.    As of appointment February 24, 2020 the following is reported: depression and anxiety are both better.  Starts day feeling good and positive with energy but around noon gets more tired and negative and depressed as day progresses.  Pattern even before the meds. Plan: Split clomipramine dosage in order to reduce side effects with a total dose of 200 mg daily. Potentiate with Abilify 10 mg daily. Check clomipramine level.   03/30/2020 phone call patient complaining of a lot of depression and anxiety for the last 3 weeks and he thought it was related to the Abilify increase.  Since the Abilify had not been helpful it was discontinued. 04/13/2020 phone call  asking for increase in lorazepam for anxiety.  04/23/2020 appointment with the following noted: More depressed and more anxiety now than last time starting about a month ago and no difference off Abilify. Sleep a lot better and thinks buspirone helps it. No SE clomipramine. Anxiety mainly at work and starts thinking about worry over the future.  Expect the worse. Plan: Clomipramine level 02/24/20 is 225 on 200 mg daily. Increase clomipramine to 250 mg daily. Risperidone augmentation 1-2 mg HS.  06/10/20 appt with the following noted: No benefit but gaining weight and night sweats. No sig change in anxiety or depression.  No panic.   Just started new job so social anxiety.  And worry about the new job bc had old one for 7 years.  Making more money and still not happy. Sadness and hopeless ness.  Going through motions of life.  No joy. Depression worse than anxiety. PE recently was good. Pt reports that mood is worse later in the day as noted  depressed moderate 6/10 and unchanged.   Anxious and describes anxiety as 4-5/10 which is better.Marland Kitchen Anxiety symptoms include: Excessive Worry, Obsessive Compulsive Symptoms:   as noted last appt,. Startled  easily is not new but maybe worse. Sleep good but night sweats  No NM since the meds.  Did have before the meds.  Pt reports that appetite is good. Pt reports that energy is down significantly and no change and good. Concentration is down slightly. Suicidal thoughts:  denied by patient.    No panic since here. OCD is better.  Would take 10 min to leave house and now can leave in 2 mins with much less anxiety and easier to let it go. He had worsening insomnia with clomipramine  HS.   Plan: Start duloxetine 1 daily and reduce clomipramine to 3 daily for 5 days, Then increase duloxetine to 2 daily and reduce clomipramine to 2 daily for 5 days, then increase duloxetine to 3 daily and continue clomipramine 2 daily Reduce risperidone to 1 nightly for 10 days then stop it  08/12/2020 appointment with the following noted: Took meds until it ran out and then didn't get it refilled re: stopping duloxetine. Stayed on clomipramine 100 mg AM.  Both OCD and depression are worse.  No motivation.  Not paying bills despite having money to pay them.  House is a mess.  Fleeting SI without intent/plan.  He wants to stop buspirone bc doesn't thing it does anything. Frustrated with how long this is taking. NV with stress in the morning on days he has to go to work.   A/P: Brief duloxetine bc noncompliance Switch to venlafaxine and continue low dose clomipramine until benefit is seen.  09/30/20 appt with following noted: Went up on Effexor as scheduled to 225 mg daily. Not too good but back to drinking heavy again.   PE last week and labs look bad.  Was told if keeps drinking his liver will fail. Drinking 2 gallons of liquor weekly.  Has not tried to cut back lately and drinking that much for 6 mos. Says 2 mos ago went 3-4 days without drinking and without withdrawal. Wants something for anxiety and outpatient detox.  Does not drive after alcohol.    Asks for prn relief med not everyday for severe anxiety.     Past Psychiatric Medication Trials: Zoloft SE, Low dose Prozac for a year NR Clomipramine 250 NR and SE Duloxetine noncompliant venlafaxine Wellbutrin rash Abilify some benefit but insomnia  Lithium  600 NR  Buspirone sleepy alprazolam irritable, lorazepam, Trazodone and mirtazapine bad dreams and tried 3-4 nights. Quetiapine 25 bad dreams, Unisom lost response   History of Wilber Bihari.  Not seen in 3-4 mos History of Andy Mitchum.  History [facility] 12/2017 and sober 3 mos but still depressed and relapsed  Review of Systems:  Review of Systems  Constitutional: Positive for fatigue.  Gastrointestinal: Positive for nausea. Negative for diarrhea and vomiting.  Neurological: Positive for dizziness. Negative for tremors and weakness.  Psychiatric/Behavioral: Positive for depression and dysphoric mood. Negative for agitation, confusion, decreased concentration and suicidal ideas. The patient is nervous/anxious.     Medications: I have reviewed the patient's current medications.  Current Outpatient Medications  Medication Sig Dispense Refill  . Multiple Vitamin (ONE-A-DAY MENS PO) Take by mouth.    . triamterene-hydrochlorothiazide (DYAZIDE) 37.5-25 MG per capsule   4  . venlafaxine XR (EFFEXOR-XR) 75 MG 24 hr capsule Take 3 capsules (225 mg total) by mouth daily at 12 noon. TAKE 1 CAPSULE DAILY FOR 5 DAYS THEN 2 CAPS DAILY FOR 5 DAYS THEN 3 CAPS DAILY (Patient taking differently: Take 225 mg by mouth daily at 12 noon. ) 270 capsule 0   No current facility-administered medications for this visit.    Medication Side Effects" : insomnia, low sex drive and weight gain, mild sweating, dry mouth  Allergies:  Allergies  Allergen Reactions  . Chantix [Varenicline] Other (See Comments)    intolerant  . Penicillins     REACTION: RASH (any "cillins" meds  . Wellbutrin [Bupropion] Hives    Unsure if this was the cause     Past Medical History:  Diagnosis Date  . 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 Meniere syndrome   . Substance abuse ([place])    alcohol abuse, active    Family History  Problem Relation Age of Onset  . Depression Mother   . Obesity Mother   . Healthy Sister     Social History   Socioeconomic History  . Marital status: Single    Spouse name: Not on file  . Number of children: Not on file  . Years of education: Not on file  . Highest education level: Not on file  Occupational History  . Occupation: Furniture conservator/restorer  Tobacco Use  . Smoking status: Current Every Day Smoker    Packs/day: 1.00    Years: 15.00    Pack years: 15.00    Types: Cigarettes  . Smokeless tobacco: Never Used  Substance and Sexual Activity  . Alcohol use: Yes    Alcohol/week: 168.0 standard drinks    Types: 168 Cans of beer per week    Comment: drinks a case of beer daily  . Drug use: Not Currently  . Sexual activity: Not Currently    Birth control/protection: Condom  Other Topics Concern  . Not on file  Social History Narrative  . Not on file   Social Determinants of Health   Financial Resource Strain:   . Difficulty of Paying Living Expenses: Not on file  Food Insecurity:   . Worried About Charity fundraiser in the Last Year: Not on file  . Ran Out of Food in the Last Year: Not on file  Transportation Needs:   . Lack of Transportation (Medical): Not on file  . Lack of Transportation (Non-Medical): Not on file  Physical Activity:   . Days of Exercise per Week: Not on  file  . Minutes of Exercise per Session: Not on file  Stress:   . Feeling of Stress : Not on file  Social Connections:   . Frequency of Communication with Friends and Family: Not on file  . Frequency of Social Gatherings with Friends and Family: Not on file  . Attends Religious Services: Not on file  . Active Member of Clubs or Organizations: Not on file  . Attends Archivist Meetings: Not on file  . Marital Status: Not on file  Intimate Partner Violence:   . Fear  of Current or Ex-Partner: Not on file  . Emotionally Abused: Not on file  . Physically Abused: Not on file  . Sexually Abused: Not on file    Past Medical History, Surgical history, Social history, and Family history were reviewed and updated as appropriate.   Please see review of systems for further details on the patient's review from today.   Objective:   Physical Exam:  There were no vitals taken for this visit.  Physical Exam Constitutional:      General: He is not in acute distress. Musculoskeletal:        General: No deformity.  Neurological:     Mental Status: He is alert and oriented to person, place, and time.     Coordination: Coordination normal.  Psychiatric:        Attention and Perception: Attention and perception normal. He does not perceive auditory or visual hallucinations.        Mood and Affect: Mood is anxious and depressed. Affect is not labile, blunt, angry or inappropriate.        Speech: Speech normal.        Behavior: Behavior normal.        Thought Content: Thought content normal. Thought content is not paranoid or delusional. Thought content does not include homicidal or suicidal ideation. Thought content does not include homicidal or suicidal plan.        Cognition and Memory: Cognition and memory normal.        Judgment: Judgment is impulsive.     Comments: Insight fair.      Lab Review:     Component Value Date/Time   NA 139 09/14/2020 0915   K 3.6 09/14/2020 0915   CL 98 09/14/2020 0915   CO2 31 09/14/2020 0915   GLUCOSE 126 (H) 09/14/2020 0915   BUN 7 09/14/2020 0915   CREATININE 0.77 09/14/2020 0915   CALCIUM 9.3 09/14/2020 0915   PROT 6.5 09/14/2020 0915   ALBUMIN 4.1 09/14/2020 0915   AST 206 (H) 09/14/2020 0915   ALT 108 (H) 09/14/2020 0915   ALKPHOS 157 (H) 09/14/2020 0915   BILITOT 1.3 (H) 09/14/2020 0915   GFRNONAA 98 01/30/2008 1430   GFRAA 119 01/30/2008 1430       Component Value Date/Time   WBC 14.1 (H) 09/14/2020  0915   RBC 4.48 09/14/2020 0915   HGB 15.5 09/14/2020 0915   HCT 45.9 09/14/2020 0915   PLT 253.0 09/14/2020 0915   MCV 102.3 (H) 09/14/2020 0915   MCHC 33.7 09/14/2020 0915   RDW 14.5 09/14/2020 0915   LYMPHSABS 2.5 09/14/2020 0915   MONOABS 0.9 09/14/2020 0915   EOSABS 0.4 09/14/2020 0915   BASOSABS 0.1 09/14/2020 0915    No results found for: POCLITH, LITHIUM   No results found for: PHENYTOIN, PHENOBARB, VALPROATE, CBMZ   September 30, 2019 clomipramine  was 92 on a dose of 75 mg daily.  02/24/2020  clomipramine level 225 on 200 mg daily.  Genesight testing : On August 31 GeneSight testing was discussed with him in detail.  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    Marland Kitchenres Assessment: Plan:    Vin was seen today for follow-up, anxiety, depression and medication problem.  Diagnoses and all orders for this visit:  Mixed obsessional thoughts and acts  Major depressive disorder, recurrent episode, moderate (HCC)  Generalized anxiety disorder  Social anxiety disorder  Insomnia due to mental condition  Alcohol dependence with alcohol-induced mood disorder ([place])    Discussed his ongoing problems with depression, anxiety, OCD, and treatment resistant insomnia as well.  Primary intrusive thoughts are sexual.  Can obsess over something back from 20 years ago. The OCD was partially responsive to the clomipramine.  As noted he has had side effects with various things of that have been tried.    Serious alcohol dependence needs detox..  Elevated liver enzymes. Needs detox and explained reasons I'm not comfortable trying to outpatient detox him outpatient bc risk Sz etc drinking more than 1 quart of liquor daily. Disc [place], [facility], [facility], [facility]  Reverse diurnal pattern of sx.  Discussed higher than usual risk of problems with  SSRIs due to the GeneSight genetics noted.    Discussed potential metabolic side effects associated with atypical antipsychotics, as well as potential risk for movement side effects. Advised pt to contact office if movement side effects occur.    Continue venlafaxine XR 225 mg daily.   Wean clomipramine over 5 days.  Reduce alsohol and consider Campral or naltrexone which might help OCD ioff label. Wait until detoxed and sober.  He doesn't think buspirone is helping anymore and wants to stop it.  OK.  Disc risk worsening.  Insomnia is a chronic problem.  Concerns about using BZ bc of alcohol.  DC lorazepam.  Recommend counseling with OCD therapist. Emphasized the importance of ERP.   6-7 weeks  Please see After Visit Summary for patient specific instructions.    Lynder Parents, MD, DFAPA   No future appointments.  No orders of the defined types were placed in this encounter.   -------------------------------

## 2020-10-26 ENCOUNTER — Telehealth: Payer: Self-pay | Admitting: Family Medicine

## 2020-10-26 DIAGNOSIS — F418 Other specified anxiety disorders: Secondary | ICD-10-CM

## 2020-10-26 NOTE — Telephone Encounter (Signed)
Ph. [phone]

## 2020-10-26 NOTE — Telephone Encounter (Signed)
Pt called in wanted to know if the doctor can put a referral in for the Dr. Odette Fraction he is set for his appointment on 02/02/21 and wanted to know if she see him earlier than that time

## 2020-10-27 NOTE — Telephone Encounter (Signed)
Is that Dr Venancio Poisson in [place] ? (psychiatry)

## 2020-10-27 NOTE — Telephone Encounter (Signed)
Yes with [facility]

## 2020-10-27 NOTE — Telephone Encounter (Signed)
Referral is done

## 2020-10-28 NOTE — Telephone Encounter (Signed)
Spoke with patient and let him know referral was faxed to dr Odette Fraction office (fax: [phone]) and I also called their office but there were not sooner appointments available at this time. Patient was added to their cancellation list. Patient aware.

## 2020-11-19 ENCOUNTER — Other Ambulatory Visit: Payer: Self-pay

## 2020-11-19 ENCOUNTER — Ambulatory Visit (INDEPENDENT_AMBULATORY_CARE_PROVIDER_SITE_OTHER): Payer: Managed Care, Other (non HMO) | Admitting: Family Medicine

## 2020-11-19 ENCOUNTER — Encounter: Payer: Self-pay | Admitting: Family Medicine

## 2020-11-19 VITALS — BP 135/85 | HR 124 | Temp 96.9°F | Ht 68.5 in | Wt 221.5 lb

## 2020-11-19 DIAGNOSIS — R7401 Elevation of levels of liver transaminase levels: Secondary | ICD-10-CM

## 2020-11-19 DIAGNOSIS — R112 Nausea with vomiting, unspecified: Secondary | ICD-10-CM | POA: Insufficient documentation

## 2020-11-19 DIAGNOSIS — F418 Other specified anxiety disorders: Secondary | ICD-10-CM | POA: Diagnosis not present

## 2020-11-19 DIAGNOSIS — F1010 Alcohol abuse, uncomplicated: Secondary | ICD-10-CM

## 2020-11-19 DIAGNOSIS — R197 Diarrhea, unspecified: Secondary | ICD-10-CM | POA: Insufficient documentation

## 2020-11-19 NOTE — Assessment & Plan Note (Signed)
Pt declines labs today  Has cut drinking by 1/2 and feels better

## 2020-11-19 NOTE — Assessment & Plan Note (Signed)
Has appt with new psychiatrist in march (on cancellation list) Sees Dr Clovis Pu now  On effexor xr  Not well controlled  No SI Is cutting back on etoh

## 2020-11-19 NOTE — Assessment & Plan Note (Signed)
Has cut etoh intake in 1/2  Feels some better Not ready to quit yet  Declines therapy/programs currently  Needs better control of dep and anx

## 2020-11-19 NOTE — Patient Instructions (Signed)
Drink lots of fluids  Keep cutting back on alcohol  Take care of yourself   Let us know when you want to check labs again   Can return to work today  If symptoms return let us know

## 2020-11-19 NOTE — Assessment & Plan Note (Signed)
This weekend, now resolved  Missed work from [employer] he had food poisoning  No symptoms at all now  Disc imp of hydration  Will reach out if symptoms return
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
## 2020-12-07 ENCOUNTER — Telehealth: Payer: Self-pay | Admitting: Psychiatry

## 2020-12-07 NOTE — Telephone Encounter (Signed)
Pt thinks that Effexor is not helping. He has cut back his drinking to half of what it was. Depression and OCD and Anxiety is out of control. Can't handle it. No follow up scheduled.

## 2020-12-07 NOTE — Telephone Encounter (Signed)
I cannot make a major med change outside of an appt.  Put him on cancellation list.  Have him increase venlafaxine XR to 4 of the 75 mg capsules daily and come to appt ASAP

## 2020-12-09 NOTE — Telephone Encounter (Signed)
Next visit is 01/27/21 and he's on the wait list for earlier visit. LM about increasing venlafaxine.

## 2020-12-17 ENCOUNTER — Telehealth: Payer: Self-pay | Admitting: Psychiatry

## 2020-12-17 NOTE — Telephone Encounter (Signed)
Pt called to report he has never felt this bad before and wants off Effexor now. Med is making things worse. Apt was moved to 3/3. Pt stated he can not wait that long to get off med. Contact  @ [phone]

## 2020-12-18 NOTE — Telephone Encounter (Signed)
Reviewed

## 2020-12-18 NOTE — Telephone Encounter (Signed)
I called and informed him.He understands and will call back if any more concerns.

## 2020-12-18 NOTE — Telephone Encounter (Signed)
He will get very dizzy and feels sick if he tries to stop this medication too quickly.  He can reduce the venlafaxine by 75 mg every 1 to 2 weeks until he is off the medication.  If he is getting dizzy then he is reducing it too fast and he needs to go slower.

## 2020-12-22 ENCOUNTER — Other Ambulatory Visit: Payer: Self-pay

## 2020-12-22 ENCOUNTER — Encounter: Payer: Self-pay | Admitting: Family Medicine

## 2020-12-22 ENCOUNTER — Ambulatory Visit (INDEPENDENT_AMBULATORY_CARE_PROVIDER_SITE_OTHER): Payer: Managed Care, Other (non HMO) | Admitting: Family Medicine

## 2020-12-22 VITALS — BP 138/89 | HR 109 | Temp 96.9°F | Ht 68.5 in | Wt 221.0 lb

## 2020-12-22 DIAGNOSIS — F418 Other specified anxiety disorders: Secondary | ICD-10-CM | POA: Diagnosis not present

## 2020-12-22 DIAGNOSIS — R112 Nausea with vomiting, unspecified: Secondary | ICD-10-CM

## 2020-12-22 DIAGNOSIS — F1010 Alcohol abuse, uncomplicated: Secondary | ICD-10-CM

## 2020-12-22 DIAGNOSIS — F17200 Nicotine dependence, unspecified, uncomplicated: Secondary | ICD-10-CM

## 2020-12-22 DIAGNOSIS — R7401 Elevation of levels of liver transaminase levels: Secondary | ICD-10-CM

## 2020-12-22 DIAGNOSIS — R197 Diarrhea, unspecified: Secondary | ICD-10-CM

## 2020-12-22 DIAGNOSIS — R030 Elevated blood-pressure reading, without diagnosis of hypertension: Secondary | ICD-10-CM
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
## 2020-12-22 NOTE — Assessment & Plan Note (Signed)
Due to etoh intake Pt has cut back  hepatomegaly on exam  Declines lab today  Enc to keep reducing etoh intake

## 2020-12-22 NOTE — Assessment & Plan Note (Signed)
Disc in detail risks of smoking and possible outcomes including copd, vascular/ heart disease, cancer , respiratory and sinus infections  Pt voices understanding Pt denies breathing problems  Not ready to quit yet

## 2020-12-22 NOTE — Assessment & Plan Note (Signed)
Episode this weekend lasting 2 d after eating BBQ  Possible intolerance vs food poisoning  Pt reports no blood in stool or vomitus or black stool Now well hydrated and able to eat  Strongly enc to continue cutting etoh intake  inst to adv diet slowly as tolerated avoiding rich/spicy foods

## 2020-12-22 NOTE — Assessment & Plan Note (Signed)
Usually improves after sitting  In the process of weaning off effexor xr which may help  Enc him to continue cutting back etoh, and consider smoking cessation  Declines labs today

## 2020-12-22 NOTE — Assessment & Plan Note (Signed)
Per pt- stable from last visit when he cut in 1/2  Declines labs to re check liver  Disc risk of esoph varices (with recent vomiting)  Not ready to quit  Is planning est with new psychiatrist/office next month in [place] to address this along with mood

## 2020-12-22 NOTE — Patient Instructions (Addendum)
Advance diet slowly and keep fluids up  Minimize alcohol the best you can  We will continue to watch blood pressure   When you are ready for labs let us know   If symptoms return let us know

## 2020-12-22 NOTE — Assessment & Plan Note (Signed)
Continues care with Dr Clovis Pu (currently weaning the effexor xr), and plan to begin tx for this and alcohol abuse in [place] with new practice next month Pt seems motivated Mood is stable today

## 2021-01-07 ENCOUNTER — Ambulatory Visit: Payer: 59 | Admitting: Psychiatry

## 2021-01-27 ENCOUNTER — Ambulatory Visit: Payer: 59 | Admitting: Psychiatry

## 2021-04-13 ENCOUNTER — Ambulatory Visit: Payer: 59 | Admitting: Psychiatry

## 2021-07-05 ENCOUNTER — Ambulatory Visit: Payer: 59 | Admitting: Psychiatry

## 2021-09-13 ENCOUNTER — Other Ambulatory Visit: Payer: Managed Care, Other (non HMO)

## 2021-09-19 ENCOUNTER — Telehealth: Payer: Self-pay | Admitting: Family Medicine

## 2021-09-19 DIAGNOSIS — E349 Endocrine disorder, unspecified: Secondary | ICD-10-CM

## 2021-09-19 DIAGNOSIS — R7303 Prediabetes: Secondary | ICD-10-CM

## 2021-09-19 DIAGNOSIS — E7800 Pure hypercholesterolemia, unspecified: Secondary | ICD-10-CM

## 2021-09-19 DIAGNOSIS — R7401 Elevation of levels of liver transaminase levels: Secondary | ICD-10-CM

## 2021-09-19 DIAGNOSIS — D72825 Bandemia: Secondary | ICD-10-CM

## 2021-09-19 NOTE — Telephone Encounter (Signed)
-----   Message from Ellamae Sia sent at 09/08/2021 12:20 PM EDT ----- Regarding: Lab orders for Monday, 11.14.22 Lab orders, testosterone?

## 2021-09-20 ENCOUNTER — Other Ambulatory Visit (INDEPENDENT_AMBULATORY_CARE_PROVIDER_SITE_OTHER): Payer: Managed Care, Other (non HMO)

## 2021-09-20 ENCOUNTER — Other Ambulatory Visit: Payer: Self-pay

## 2021-09-20 DIAGNOSIS — E7800 Pure hypercholesterolemia, unspecified: Secondary | ICD-10-CM | POA: Diagnosis not present

## 2021-09-20 DIAGNOSIS — D72825 Bandemia: Secondary | ICD-10-CM | POA: Diagnosis not present

## 2021-09-20 DIAGNOSIS — E349 Endocrine disorder, unspecified: Secondary | ICD-10-CM

## 2021-09-20 DIAGNOSIS — R7303 Prediabetes: Secondary | ICD-10-CM | POA: Diagnosis not present

## 2021-09-20 DIAGNOSIS — R7401 Elevation of levels of liver transaminase levels: Secondary | ICD-10-CM | POA: Diagnosis not present

## 2021-09-20 LAB — COMPREHENSIVE METABOLIC PANEL
ALT: 57 U/L — ABNORMAL HIGH (ref 0–53)
AST: 36 U/L (ref 0–37)
Albumin: 4.9 g/dL (ref 3.5–5.2)
Alkaline Phosphatase: 113 U/L (ref 39–117)
BUN: 17 mg/dL (ref 6–23)
CO2: 31 mEq/L (ref 19–32)
Calcium: 10.2 mg/dL (ref 8.4–10.5)
Chloride: 101 mEq/L (ref 96–112)
Creatinine, Ser: 0.81 mg/dL (ref 0.40–1.50)
GFR: 112.69 mL/min (ref >60.00)
Glucose, Bld: 94 mg/dL (ref 70–99)
Potassium: 4.3 mEq/L (ref 3.5–5.1)
Sodium: 140 mEq/L (ref 135–145)
Total Bilirubin: 0.7 mg/dL (ref 0.2–1.2)
Total Protein: 7.4 g/dL (ref 6.0–8.3)

## 2021-09-20 LAB — CBC WITH DIFFERENTIAL/PLATELET
Basophils Absolute: 0.1 10*3/uL (ref 0.0–0.1)
Basophils Relative: 0.5 % (ref 0.0–3.0)
Eosinophils Absolute: 0.3 10*3/uL (ref 0.0–0.7)
Eosinophils Relative: 1.8 % (ref 0.0–5.0)
HCT: 53.3 % — ABNORMAL HIGH (ref 39.0–52.0)
Hemoglobin: 17.7 g/dL — ABNORMAL HIGH (ref 13.0–17.0)
Lymphocytes Relative: 17.6 % (ref 12.0–46.0)
Lymphs Abs: 2.5 10*3/uL (ref 0.7–4.0)
MCHC: 33.2 g/dL (ref 30.0–36.0)
MCV: 91.5 fl (ref 78.0–100.0)
Monocytes Absolute: 1.1 10*3/uL — ABNORMAL HIGH (ref 0.1–1.0)
Monocytes Relative: 7.6 % (ref 3.0–12.0)
Neutro Abs: 10.4 10*3/uL — ABNORMAL HIGH (ref 1.4–7.7)
Neutrophils Relative %: 72.5 % (ref 43.0–77.0)
Platelets: 250 10*3/uL (ref 150.0–400.0)
RBC: 5.82 Mil/uL — ABNORMAL HIGH (ref 4.22–5.81)
RDW: 16.5 % — ABNORMAL HIGH (ref 11.5–15.5)
WBC: 14.4 10*3/uL — ABNORMAL HIGH (ref 4.0–10.5)

## 2021-09-20 LAB — HEMOGLOBIN A1C: Hgb A1c MFr Bld: 6.3 % (ref 4.6–6.5)

## 2021-09-20 LAB — TESTOSTERONE: Testosterone: 342.4 ng/dL (ref 300.00–890.00)

## 2021-09-20 LAB — LIPID PANEL
Cholesterol: 319 mg/dL — ABNORMAL HIGH (ref 0–200)
HDL: 50.3 mg/dL (ref >39.00)
LDL Cholesterol: 236 mg/dL — ABNORMAL HIGH (ref 0–99)
NonHDL: 268.95
Total CHOL/HDL Ratio: 6
Triglycerides: 163 mg/dL — ABNORMAL HIGH (ref 0.0–149.0)
VLDL: 32.6 mg/dL (ref 0.0–40.0)

## 2021-09-24 ENCOUNTER — Encounter: Payer: Managed Care, Other (non HMO) | Admitting: Family Medicine

## 2021-09-28 ENCOUNTER — Ambulatory Visit (INDEPENDENT_AMBULATORY_CARE_PROVIDER_SITE_OTHER): Payer: Managed Care, Other (non HMO) | Admitting: Family Medicine

## 2021-09-28 ENCOUNTER — Other Ambulatory Visit: Payer: Self-pay

## 2021-09-28 ENCOUNTER — Encounter: Payer: Self-pay | Admitting: Family Medicine

## 2021-09-28 VITALS — BP 130/85 | HR 98 | Temp 98.3°F | Ht 68.75 in | Wt 214.1 lb

## 2021-09-28 DIAGNOSIS — E349 Endocrine disorder, unspecified: Secondary | ICD-10-CM

## 2021-09-28 DIAGNOSIS — R030 Elevated blood-pressure reading, without diagnosis of hypertension: Secondary | ICD-10-CM

## 2021-09-28 DIAGNOSIS — H8109 Meniere's disease, unspecified ear: Secondary | ICD-10-CM

## 2021-09-28 DIAGNOSIS — E7800 Pure hypercholesterolemia, unspecified: Secondary | ICD-10-CM

## 2021-09-28 DIAGNOSIS — E669 Obesity, unspecified: Secondary | ICD-10-CM

## 2021-09-28 DIAGNOSIS — F418 Other specified anxiety disorders: Secondary | ICD-10-CM

## 2021-09-28 DIAGNOSIS — R7303 Prediabetes: Secondary | ICD-10-CM

## 2021-09-28 DIAGNOSIS — R7401 Elevation of levels of liver transaminase levels: Secondary | ICD-10-CM

## 2021-09-28 DIAGNOSIS — F17200 Nicotine dependence, unspecified, uncomplicated: Secondary | ICD-10-CM

## 2021-09-28 DIAGNOSIS — Z0001 Encounter for general adult medical examination with abnormal findings: Secondary | ICD-10-CM | POA: Diagnosis not present

## 2021-09-28 DIAGNOSIS — D72825 Bandemia: Secondary | ICD-10-CM | POA: Diagnosis not present

## 2021-09-28 DIAGNOSIS — F1010 Alcohol abuse, uncomplicated: Secondary | ICD-10-CM

## 2021-09-28 MED ORDER — ROSUVASTATIN CALCIUM 5 MG PO TABS: 5.0000 mg | ORAL_TABLET | Freq: Every day | ORAL | 0 refills | Status: DC

## 2021-09-28 NOTE — Assessment & Plan Note (Signed)
Stable on dyazide under care of ENT

## 2021-09-28 NOTE — Assessment & Plan Note (Signed)
bp down after sitting BP: 130/85

## 2021-09-28 NOTE — Assessment & Plan Note (Signed)
Pt has stopped liquor (liver tests are improved) Now beer-from 3-15 per day (less on week days) Not ready to quit/has tried in the past and rehab was not that helpful  Discussed health risks currently including liver/prediabetes  inst to let us know when/if he is ready to cut intake further or quit and will offer help

## 2021-09-28 NOTE — Assessment & Plan Note (Signed)
Significant improvement with less etoh intake (but still drinking beer)  Also with d/c of effexor  Encouraged strongly to quit alcohol or continue to cut amount  Will continue to follow

## 2021-09-28 NOTE — Assessment & Plan Note (Signed)
Chronic/stable due to smoking Hb is up to 17.7 as well  Has seen hematology in the past   Strongly enc smoking cessation

## 2021-09-28 NOTE — Assessment & Plan Note (Signed)
Disc in detail risks of smoking and possible outcomes including copd, vascular/ heart disease, cancer , respiratory and sinus infections  Pt voices understanding Also wbc and Hb up from smoking Pt states he is not ready to cut back or quit at this time

## 2021-09-28 NOTE — Assessment & Plan Note (Signed)
Level in nl range at 342 No new symptoms

## 2021-09-28 NOTE — Assessment & Plan Note (Signed)
No longer on medication Thinks he is doing ok without it  PHQ score of 0  Has seen Dr Clovis Pu in the past  Also abuses etoh

## 2021-09-28 NOTE — Patient Instructions (Addendum)
Avoid red meat/ fried foods/ egg yolks/ fatty breakfast meats/ butter, cheese and high fat dairy/ and shellfish   Cholesterol is up significantly   Take crestor 5 mg daily for cholesterol  We will need to check labs in 4-6 weeks fasting   For diabetes prevention  Try to get most of your carbohydrates from produce (with the exception of white potatoes)  Eat less bread/pasta/rice/snack foods/cereals/sweets and other items from the middle of the grocery store (processed carbs)

## 2021-09-28 NOTE — Assessment & Plan Note (Signed)
Very high cholesterol with LDL of 236- both poor diet and genetic factors likely  Disc goals for lipids and reasons to control them Rev last labs with pt Rev low sat fat diet in detail Unsure if he would tolerate statin with liver/etoh hx Will try crestor 5 mg daily and re check lab in 4-6 wk May be candidate for PCYK9 in the future/consider lipid clinic Pt will work to reduce sat fats in diet as well Disc risks of vascular dz

## 2021-09-28 NOTE — Assessment & Plan Note (Signed)
Lab Results  Component Value Date   HGBA1C 6.3 09/20/2021   This is down from 7.1 a year ago  He is now more interested in lifestyle change  Disc the role of etoh  Low glycemic diet encouraged

## 2021-09-28 NOTE — Assessment & Plan Note (Signed)
Discussed how this problem influences overall health and the risks it imposes  Reviewed plan for weight loss with lower calorie diet (via better food choices and also portion control or program like weight watchers) and exercise building up to or more than 30 minutes 5 days per week including some aerobic activity    
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
## 2021-09-28 NOTE — Assessment & Plan Note (Signed)
Reviewed health habits including diet and exercise and skin cancer prevention Reviewed appropriate screening tests for age  Also reviewed health mt list, fam hx and immunization status , as well as social and family history   See HPI Labs reviewed  Declines flu, covid or pna vaccine  No prostate symptoms  Counseled re: risks of smoking and etoh intake (not ready to quit or change) Continues mvi daily  Mood is better/no longer on medication  Encouraged low glycemic diet for diabetes prevention and exercise for wt loss

## 2021-10-11 ENCOUNTER — Ambulatory Visit: Payer: Managed Care, Other (non HMO) | Admitting: Family Medicine

## 2021-11-15 ENCOUNTER — Other Ambulatory Visit: Payer: Managed Care, Other (non HMO)

## 2021-11-22 ENCOUNTER — Other Ambulatory Visit: Payer: Managed Care, Other (non HMO)

## 2021-11-29 ENCOUNTER — Other Ambulatory Visit: Payer: Self-pay

## 2021-11-29 ENCOUNTER — Other Ambulatory Visit (INDEPENDENT_AMBULATORY_CARE_PROVIDER_SITE_OTHER): Payer: Managed Care, Other (non HMO)

## 2021-11-29 ENCOUNTER — Telehealth: Payer: Self-pay | Admitting: Family Medicine

## 2021-11-29 DIAGNOSIS — E7800 Pure hypercholesterolemia, unspecified: Secondary | ICD-10-CM

## 2021-11-29 DIAGNOSIS — R7401 Elevation of levels of liver transaminase levels: Secondary | ICD-10-CM

## 2021-11-29 LAB — HEPATIC FUNCTION PANEL
ALT: 137 U/L — ABNORMAL HIGH (ref 0–53)
AST: 60 U/L — ABNORMAL HIGH (ref 0–37)
Albumin: 4.6 g/dL (ref 3.5–5.2)
Alkaline Phosphatase: 134 U/L — ABNORMAL HIGH (ref 39–117)
Bilirubin, Direct: 0.1 mg/dL (ref 0.0–0.3)
Total Bilirubin: 0.9 mg/dL (ref 0.2–1.2)
Total Protein: 7.5 g/dL (ref 6.0–8.3)

## 2021-11-29 LAB — LIPID PANEL
Cholesterol: 225 mg/dL — ABNORMAL HIGH (ref 0–200)
HDL: 63.2 mg/dL (ref >39.00)
NonHDL: 162.21
Total CHOL/HDL Ratio: 4
Triglycerides: 210 mg/dL — ABNORMAL HIGH (ref 0.0–149.0)
VLDL: 42 mg/dL — ABNORMAL HIGH (ref 0.0–40.0)

## 2021-11-29 LAB — LDL CHOLESTEROL, DIRECT: Direct LDL: 132 mg/dL

## 2021-11-29 NOTE — Telephone Encounter (Signed)
I sent referral so he will get a call   Please watch diet  Avoid red meat/ fried foods/ egg yolks/ fatty breakfast meats/ butter, cheese and high fat dairy/ and shellfish   Please cut down or optimally quit alcohol   Re check liver tests please in a month

## 2021-11-29 NOTE — Telephone Encounter (Signed)
-----   Message from Ophelia Shoulder, [place] sent at 11/29/2021  4:39 PM EST ----- Patient called and informed of his results and the recommendations and questions from Dr. Glori Bickers. Patient stated that he did up his intake of alcohol. Patient stated he would consider the injection if it would have less impact on the liver unlike the pills. Patient requested to have cholesterol specialist and stated that he will try to lower his intake of alcohol and try to have a healthier diet.

## 2021-11-30 NOTE — Telephone Encounter (Signed)
Left VM requesting pt to call the office back 

## 2021-12-01 NOTE — Telephone Encounter (Signed)
Pt returning your call

## 2021-12-02 NOTE — Telephone Encounter (Signed)
Pt notified referral placed and advised of Dr. Marliss Coots instructions. F/u lab appt scheduled

## 2022-01-02 ENCOUNTER — Telehealth: Payer: Self-pay | Admitting: Family Medicine

## 2022-01-02 DIAGNOSIS — E7800 Pure hypercholesterolemia, unspecified: Secondary | ICD-10-CM

## 2022-01-02 DIAGNOSIS — R7401 Elevation of levels of liver transaminase levels: Secondary | ICD-10-CM

## 2022-01-02 NOTE — Telephone Encounter (Signed)
-----   Message from Ellamae Sia sent at 12/24/2021  9:13 AM EST ----- Regarding: Lab orders for Monday, 2.27.23 Lab orders for a 1 month f/u

## 2022-01-03 ENCOUNTER — Other Ambulatory Visit: Payer: Self-pay

## 2022-01-03 ENCOUNTER — Other Ambulatory Visit (INDEPENDENT_AMBULATORY_CARE_PROVIDER_SITE_OTHER): Payer: Managed Care, Other (non HMO)

## 2022-01-03 DIAGNOSIS — E7800 Pure hypercholesterolemia, unspecified: Secondary | ICD-10-CM | POA: Diagnosis not present

## 2022-01-03 DIAGNOSIS — R7401 Elevation of levels of liver transaminase levels: Secondary | ICD-10-CM | POA: Diagnosis not present

## 2022-01-03 LAB — LIPID PANEL
Cholesterol: 294 mg/dL — ABNORMAL HIGH (ref 0–200)
HDL: 49.3 mg/dL (ref >39.00)
NonHDL: 245.02
Total CHOL/HDL Ratio: 6
Triglycerides: 217 mg/dL — ABNORMAL HIGH (ref 0.0–149.0)
VLDL: 43.4 mg/dL — ABNORMAL HIGH (ref 0.0–40.0)

## 2022-01-03 LAB — HEPATIC FUNCTION PANEL
ALT: 56 U/L — ABNORMAL HIGH (ref 0–53)
AST: 37 U/L (ref 0–37)
Albumin: 4.7 g/dL (ref 3.5–5.2)
Alkaline Phosphatase: 111 U/L (ref 39–117)
Bilirubin, Direct: 0.1 mg/dL (ref 0.0–0.3)
Total Bilirubin: 0.6 mg/dL (ref 0.2–1.2)
Total Protein: 7.3 g/dL (ref 6.0–8.3)

## 2022-01-03 LAB — LDL CHOLESTEROL, DIRECT: Direct LDL: 214 mg/dL

## 2022-02-03 ENCOUNTER — Ambulatory Visit: Payer: Managed Care, Other (non HMO) | Admitting: Internal Medicine

## 2022-06-08 ENCOUNTER — Encounter: Payer: Self-pay | Admitting: Family Medicine

## 2022-06-08 ENCOUNTER — Ambulatory Visit (INDEPENDENT_AMBULATORY_CARE_PROVIDER_SITE_OTHER): Payer: Managed Care, Other (non HMO) | Admitting: Family Medicine

## 2022-06-08 VITALS — BP 122/70 | HR 85 | Ht 68.75 in | Wt 211.8 lb

## 2022-06-08 DIAGNOSIS — R7401 Elevation of levels of liver transaminase levels: Secondary | ICD-10-CM

## 2022-06-08 DIAGNOSIS — R7303 Prediabetes: Secondary | ICD-10-CM | POA: Diagnosis not present

## 2022-06-08 DIAGNOSIS — F17200 Nicotine dependence, unspecified, uncomplicated: Secondary | ICD-10-CM

## 2022-06-08 DIAGNOSIS — E7800 Pure hypercholesterolemia, unspecified: Secondary | ICD-10-CM

## 2022-06-08 DIAGNOSIS — R350 Frequency of micturition: Secondary | ICD-10-CM | POA: Diagnosis not present

## 2022-06-08 DIAGNOSIS — F1010 Alcohol abuse, uncomplicated: Secondary | ICD-10-CM

## 2022-06-08 DIAGNOSIS — R200 Anesthesia of skin: Secondary | ICD-10-CM | POA: Diagnosis not present

## 2022-06-08 LAB — POC URINALSYSI DIPSTICK (AUTOMATED)
Bilirubin, UA: NEGATIVE
Blood, UA: NEGATIVE
Glucose, UA: NEGATIVE
Ketones, UA: NEGATIVE
Leukocytes, UA: NEGATIVE
Nitrite, UA: NEGATIVE
Protein, UA: NEGATIVE
Spec Grav, UA: 1.015 (ref 1.010–1.025)
Urobilinogen, UA: 0.2 E.U./dL
pH, UA: 6 (ref 5.0–8.0)

## 2022-06-08 LAB — LIPID PANEL
Cholesterol: 307 mg/dL — ABNORMAL HIGH (ref 0–200)
HDL: 41.6 mg/dL (ref >39.00)
LDL Cholesterol: 235 mg/dL — ABNORMAL HIGH (ref 0–99)
NonHDL: 264.9
Total CHOL/HDL Ratio: 7
Triglycerides: 148 mg/dL (ref 0.0–149.0)
VLDL: 29.6 mg/dL (ref 0.0–40.0)

## 2022-06-08 LAB — BASIC METABOLIC PANEL
BUN: 15 mg/dL (ref 6–23)
CO2: 28 mEq/L (ref 19–32)
Calcium: 10.4 mg/dL (ref 8.4–10.5)
Chloride: 98 mEq/L (ref 96–112)
Creatinine, Ser: 0.85 mg/dL (ref 0.40–1.50)
GFR: 110.5 mL/min (ref >60.00)
Glucose, Bld: 86 mg/dL (ref 70–99)
Potassium: 4.3 mEq/L (ref 3.5–5.1)
Sodium: 137 mEq/L (ref 135–145)

## 2022-06-08 LAB — HEPATIC FUNCTION PANEL
ALT: 60 U/L — ABNORMAL HIGH (ref 0–53)
AST: 31 U/L (ref 0–37)
Albumin: 5.1 g/dL (ref 3.5–5.2)
Alkaline Phosphatase: 115 U/L (ref 39–117)
Bilirubin, Direct: 0.1 mg/dL (ref 0.0–0.3)
Total Bilirubin: 0.7 mg/dL (ref 0.2–1.2)
Total Protein: 7.5 g/dL (ref 6.0–8.3)

## 2022-06-08 LAB — HEMOGLOBIN A1C: Hgb A1c MFr Bld: 6.5 % (ref 4.6–6.5)

## 2022-06-08 NOTE — Assessment & Plan Note (Signed)
Disc in detail risks of smoking and possible outcomes including copd, vascular/ heart disease, cancer , respiratory and sinus infections  Pt voices understanding He is not ready to quit  Did recently stop drinking etoh

## 2022-06-08 NOTE — Assessment & Plan Note (Signed)
Worse since quitting etoh ua clear  No problems with stream  Labs ordered incl a1c

## 2022-06-08 NOTE — Assessment & Plan Note (Signed)
C/o frequent urination   a1c ordered  He stopped etoh 13 d ago   disc imp of low glycemic diet and wt loss to prevent DM2

## 2022-06-08 NOTE — Assessment & Plan Note (Signed)
No etoh for 13 days  Feeling better overall Frequent urination but no edema  Lab ordered

## 2022-06-08 NOTE — Assessment & Plan Note (Signed)
Quit 13 days ago and doing fairly well so far  No w/d symptoms today  Feels better overall  Declines help with this  Dislikes 12 step program

## 2022-06-08 NOTE — Patient Instructions (Addendum)
I sent a referral to hand specialist  If you don't get a call in 1-2 weeks please let us know   Keep up the good work with abstinence  Labs today  Urinalysis today   If symptoms worsen let me know

## 2022-06-08 NOTE — Assessment & Plan Note (Signed)
Labs ordered  Pt plans on quitting drinking (13 d etoh free) Hopes he may be a candidate for statin  lfts ordered
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
## 2022-06-08 NOTE — Assessment & Plan Note (Signed)
Dorsal L index finger  Nl exam except for slt dec sharp /pin sensation  Nl motor exam  Neg phalen/tinel   He is bothered by this  ? If from CS, he has MRI of cs planned from ortho for R sided symptoms   Ref to hand specialist for further eval at pt req

## 2022-06-13 ENCOUNTER — Encounter: Payer: Self-pay | Admitting: Family Medicine

## 2022-07-12 ENCOUNTER — Ambulatory Visit (HOSPITAL_BASED_OUTPATIENT_CLINIC_OR_DEPARTMENT_OTHER): Payer: Managed Care, Other (non HMO) | Admitting: Internal Medicine

## 2022-07-12 ENCOUNTER — Telehealth: Payer: Self-pay | Admitting: Internal Medicine

## 2022-07-12 ENCOUNTER — Encounter (HOSPITAL_BASED_OUTPATIENT_CLINIC_OR_DEPARTMENT_OTHER): Payer: Self-pay | Admitting: Internal Medicine

## 2022-07-12 VITALS — BP 134/93 | HR 86 | Ht 69.0 in | Wt 210.5 lb

## 2022-07-12 DIAGNOSIS — F1090 Alcohol use, unspecified, uncomplicated: Secondary | ICD-10-CM | POA: Diagnosis not present

## 2022-07-12 DIAGNOSIS — T466X5A Adverse effect of antihyperlipidemic and antiarteriosclerotic drugs, initial encounter: Secondary | ICD-10-CM | POA: Diagnosis not present

## 2022-07-12 DIAGNOSIS — K719 Toxic liver disease, unspecified: Secondary | ICD-10-CM | POA: Diagnosis not present

## 2022-07-12 DIAGNOSIS — E7849 Other hyperlipidemia: Secondary | ICD-10-CM

## 2022-07-12 NOTE — Telephone Encounter (Signed)
Genetic test for dyslipidemia/ASCVD ordered ([facility]) Cheek swab completed in office Specimen and necessary paperwork mailed. ID: [id number]

## 2022-07-12 NOTE — Patient Instructions (Signed)
Medication Instructions:  Dr. Debara Pickett has recommended LEQVIO This medication is administered for 2 doses - 3 months apart - and then every 6 months after  *If you need a refill on your cardiac medications before your next appointment, please call your pharmacy*   Lab Work: Non-Fasting Liver Function Test - 2-4 weeks after 1st Leqvio injection   Fasting NMR lipoprofile, LPa in 5-6 months -- before next visit   If you have labs (blood work) drawn today and your tests are completely normal, you will receive your results only by: [place] (if you have MyChart) OR A paper copy in the mail If you have any lab test that is abnormal or we need to change your treatment, we will call you to review the results.   Testing/Procedures: Genetic Test - results should be available in 2-3 weeks. Dr. Debara Pickett will reach out to explain    Follow-Up: At [facility], you and your health needs are our priority.  As part of our continuing mission to provide you with exceptional heart care, we have created designated Provider Care Teams.  These Care Teams include your primary Cardiologist (physician) and Advanced Practice Providers (APPs -  Physician Assistants and Nurse Practitioners) who all work together to provide you with the care you need, when you need it.  We recommend signing up for the patient portal called "MyChart".  Sign up information is provided on this After Visit Summary.  MyChart is used to connect with patients for Virtual Visits (Telemedicine).  Patients are able to view lab/test results, encounter notes, upcoming appointments, etc.  Non-urgent messages can be sent to your provider as well.   To learn more about what you can do with MyChart, go to [email or web address].    Your next appointment:    5-6 months with Dr. Debara Pickett
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
## 2022-07-13 ENCOUNTER — Telehealth: Payer: Self-pay | Admitting: Internal Medicine

## 2022-07-13 DIAGNOSIS — E7801 Familial hypercholesterolemia: Secondary | ICD-10-CM

## 2022-07-13 NOTE — Telephone Encounter (Signed)
Patient identified for [place] Benefits investigation form faxed to [phone]

## 2022-07-14 NOTE — Telephone Encounter (Signed)
Waiting on benefits investigation info  

## 2022-07-22 ENCOUNTER — Encounter: Payer: Self-pay | Admitting: Internal Medicine

## 2022-07-22 DIAGNOSIS — E78019 Familial hypercholesterolemia, unspecified: Secondary | ICD-10-CM | POA: Insufficient documentation

## 2022-07-22 DIAGNOSIS — E7801 Familial hypercholesterolemia: Secondary | ICD-10-CM | POA: Insufficient documentation

## 2022-07-22 NOTE — Addendum Note (Signed)
Addended by: Fidel Levy on: 07/22/2022 03:18 PM   Modules accepted: Orders

## 2022-07-22 NOTE — Telephone Encounter (Signed)
Referral to [facility] ordered Patient updated in [place]

## 2022-07-22 NOTE — Telephone Encounter (Signed)
Patient has been identified as candidate for Leqvio  Benefits investigation enrollment completed on 07/22/22  Benefits investigation report notes the following:  Type of insurance: commercial - Engineer, agricultural [employer]  OOP Max: $3500 / Met: $1227.29  Deductible: $3000  Co-insurance: 30%  PA required: Y  PA phone number: not provided  Benefits Summary Details:  Coverage subject to $1000 calendar year deductible. Once met, Lance Foley is covered at 70% and patient is responsible for 30% until reaching $3500 OOP max. Once this is met, Lance Foley is covered at 100%. The provider is able to acquire [facility] thru [facility], however  the [facility] does not offer Leqvio. The provider must call [employer] at [phone] with the names of preferred pharmacies to confirm if they are in network with the plan

## 2022-07-25 ENCOUNTER — Telehealth: Payer: Self-pay | Admitting: Pharmacy Technician

## 2022-07-25 NOTE — Telephone Encounter (Addendum)
Auth Submission: APPROVED Payer: [employer] Medication & CPT/J Code(s) submitted: Leqvio ([place]cl[place]) [id number] Route of submission (phone, fax, portal):  Phone [phone] Fax # [phone] Auth type: Buy/Bill Units/visits requested: Q3 MONTHS X2 DOSES Reference [id number] Approval from: 02/07/23  to 02/10/24 at [place] co-pay card: Approved Card# [id number] [place]: [id number] [employer]: [id number] PCN: [id number]

## 2022-07-28 ENCOUNTER — Encounter: Payer: Self-pay | Admitting: Internal Medicine

## 2022-08-01 ENCOUNTER — Other Ambulatory Visit: Payer: Self-pay

## 2022-08-01 DIAGNOSIS — R7303 Prediabetes: Secondary | ICD-10-CM | POA: Diagnosis present

## 2022-08-01 DIAGNOSIS — F17210 Nicotine dependence, cigarettes, uncomplicated: Secondary | ICD-10-CM | POA: Diagnosis present

## 2022-08-01 DIAGNOSIS — F429 Obsessive-compulsive disorder, unspecified: Secondary | ICD-10-CM | POA: Diagnosis present

## 2022-08-01 DIAGNOSIS — G4730 Sleep apnea, unspecified: Secondary | ICD-10-CM | POA: Diagnosis present

## 2022-08-01 DIAGNOSIS — H8109 Meniere's disease, unspecified ear: Secondary | ICD-10-CM | POA: Diagnosis present

## 2022-08-01 DIAGNOSIS — H9193 Unspecified hearing loss, bilateral: Secondary | ICD-10-CM | POA: Diagnosis present

## 2022-08-01 DIAGNOSIS — F1010 Alcohol abuse, uncomplicated: Secondary | ICD-10-CM | POA: Diagnosis present

## 2022-08-01 DIAGNOSIS — Z880 Allergy status to penicillin: Secondary | ICD-10-CM

## 2022-08-01 DIAGNOSIS — Z23 Encounter for immunization: Secondary | ICD-10-CM

## 2022-08-01 DIAGNOSIS — D72829 Elevated white blood cell count, unspecified: Secondary | ICD-10-CM | POA: Diagnosis present

## 2022-08-01 DIAGNOSIS — E876 Hypokalemia: Secondary | ICD-10-CM | POA: Diagnosis present

## 2022-08-01 DIAGNOSIS — Z888 Allergy status to other drugs, medicaments and biological substances status: Secondary | ICD-10-CM

## 2022-08-01 DIAGNOSIS — E7801 Familial hypercholesterolemia: Secondary | ICD-10-CM | POA: Diagnosis present

## 2022-08-01 DIAGNOSIS — Z79899 Other long term (current) drug therapy: Secondary | ICD-10-CM

## 2022-08-01 DIAGNOSIS — Z818 Family history of other mental and behavioral disorders: Secondary | ICD-10-CM

## 2022-08-01 DIAGNOSIS — T63091A Toxic effect of venom of other snake, accidental (unintentional), initial encounter: Principal | ICD-10-CM | POA: Diagnosis present

## 2022-08-01 DIAGNOSIS — M79671 Pain in right foot: Secondary | ICD-10-CM | POA: Diagnosis not present

## 2022-08-01 LAB — COMPREHENSIVE METABOLIC PANEL
ALT: 54 U/L — ABNORMAL HIGH (ref 0–44)
AST: 36 U/L (ref 15–41)
Albumin: 4.4 g/dL (ref 3.5–5.0)
Alkaline Phosphatase: 114 U/L (ref 38–126)
Anion gap: 15 (ref 5–15)
BUN: 11 mg/dL (ref 6–20)
CO2: 22 mmol/L (ref 22–32)
Calcium: 9.6 mg/dL (ref 8.9–10.3)
Chloride: 100 mmol/L (ref 98–111)
Creatinine, Ser: 0.76 mg/dL (ref 0.61–1.24)
GFR, Estimated: >60 mL/min (ref >60)
Glucose, Bld: 99 mg/dL (ref 70–99)
Potassium: 3.3 mmol/L — ABNORMAL LOW (ref 3.5–5.1)
Sodium: 137 mmol/L (ref 135–145)
Total Bilirubin: 1.4 mg/dL — ABNORMAL HIGH (ref 0.3–1.2)
Total Protein: 7.7 g/dL (ref 6.5–8.1)

## 2022-08-01 LAB — CBC WITH DIFFERENTIAL/PLATELET
Abs Immature Granulocytes: 0.25 10*3/uL — ABNORMAL HIGH (ref 0.00–0.07)
Basophils Absolute: 0.1 10*3/uL (ref 0.0–0.1)
Basophils Relative: 1 %
Eosinophils Absolute: 0.5 10*3/uL (ref 0.0–0.5)
Eosinophils Relative: 3 %
HCT: 48.3 % (ref 39.0–52.0)
Hemoglobin: 16.7 g/dL (ref 13.0–17.0)
Immature Granulocytes: 1 %
Lymphocytes Relative: 25 %
Lymphs Abs: 4.3 10*3/uL — ABNORMAL HIGH (ref 0.7–4.0)
MCH: 30.8 pg (ref 26.0–34.0)
MCHC: 34.6 g/dL (ref 30.0–36.0)
MCV: 89.1 fL (ref 80.0–100.0)
Monocytes Absolute: 0.9 10*3/uL (ref 0.1–1.0)
Monocytes Relative: 5 %
Neutro Abs: 11.3 10*3/uL — ABNORMAL HIGH (ref 1.7–7.7)
Neutrophils Relative %: 65 %
Platelets: 208 10*3/uL (ref 150–400)
RBC: 5.42 MIL/uL (ref 4.22–5.81)
RDW: 13.3 % (ref 11.5–15.5)
WBC: 17.3 10*3/uL — ABNORMAL HIGH (ref 4.0–10.5)
nRBC: 0 % (ref 0.0–0.2)

## 2022-08-01 MED ORDER — OXYCODONE-ACETAMINOPHEN 5-325 MG PO TABS
1.0000 | ORAL_TABLET | Freq: Once | ORAL | Status: AC
  Administered 2022-08-01: 1 via ORAL
  Filled 2022-08-01: qty 1

## 2022-08-01 NOTE — ED Provider Triage Note (Signed)
Emergency Medicine Provider Triage Evaluation Note  Lance Foley , a 36 y.o. male  was evaluated in triage.  Pt complains of right foot pain.  Patient states that he walked onto his porch in flip-flops when he felt a sudden sharp pain in the lateral aspect of his foot.  He felt like fire had hit the side of his foot.  Patient went down and did not see any animals, insects or snakes.  States that he did notice to what appeared to be puncture wounds to the side of the foot.  Patient has had increasing pain, swelling to the foot.  This occurred 2 hours prior to arrival and patient has gross edema of the entire foot at this time.  There are 2 small marks to the side of the foot.  Review of Systems  Positive: Right foot pain, swelling Negative: Chest pain, shortness of breath, fevers, chills, calf pain  Physical Exam  BP (!) 174/117 (BP Location: Left Arm)   Pulse (!) 109   Temp 98.1 F (36.7 C) (Oral)   Resp 18   Ht '5\' 9"'$  (1.753 m)   Wt 93 kg   SpO2 99%   BMI 30.27 kg/m  Gen:   Awake, no distress   Resp:  Normal effort  MSK:   Moves extremities without difficulty.  Gross edema, tenderness to the foot.  Indelible marker placed above the area of edema around the ankle.  There are 2 marks to the lateral aspect of the foot which are circled with indelible marker. Other:    Medical Decision Making  Medically screening exam initiated at 10:31 PM.  Appropriate orders placed.  Lance Foley was informed that the remainder of the evaluation will be completed by another provider, this initial triage assessment does not replace that evaluation, and the importance of remaining in the ED until their evaluation is complete.  Patient arrives with right foot pain, swelling.  This was sudden onset.  Patient walked out onto a porch, felt a sharp sensation in his foot.  Patient did not see any stings, insects, animals but had rapidly worsening pain and edema.  Indelible marker placed.  Basic labs ordered.   Patient is given pain medication.  Given the presentation with 2 puncture type wounds, will there is some concern for snakebite versus spider bite versus insect bite.  We will observe the patient at this time for worsening.   Darletta Moll, PA-C 08/01/22 2231

## 2022-08-01 NOTE — ED Triage Notes (Signed)
Pt arrives from home via POV. States that he was bitten by something unseen at approx 2030 [place]  Rt foot visibly swollen with red bite marks lateral.   Per pt rt foot is tight and painful. Describes pain as "hot,"

## 2022-08-02 ENCOUNTER — Inpatient Hospital Stay
Admission: EM | Admit: 2022-08-02 | Discharge: 2022-08-05 | DRG: 918 | Disposition: A | Discharge status: Home / Self Care | Payer: Managed Care, Other (non HMO) | Attending: Family Medicine | Admitting: Family Medicine

## 2022-08-02 ENCOUNTER — Observation Stay: Payer: Managed Care, Other (non HMO)

## 2022-08-02 DIAGNOSIS — F1010 Alcohol abuse, uncomplicated: Secondary | ICD-10-CM | POA: Diagnosis present

## 2022-08-02 DIAGNOSIS — H8109 Meniere's disease, unspecified ear: Secondary | ICD-10-CM | POA: Diagnosis present

## 2022-08-02 DIAGNOSIS — F17200 Nicotine dependence, unspecified, uncomplicated: Secondary | ICD-10-CM | POA: Diagnosis present

## 2022-08-02 DIAGNOSIS — T148XXA Other injury of unspecified body region, initial encounter: Secondary | ICD-10-CM

## 2022-08-02 DIAGNOSIS — E876 Hypokalemia: Principal | ICD-10-CM

## 2022-08-02 DIAGNOSIS — R52 Pain, unspecified: Secondary | ICD-10-CM

## 2022-08-02 DIAGNOSIS — F429 Obsessive-compulsive disorder, unspecified: Secondary | ICD-10-CM | POA: Diagnosis present

## 2022-08-02 DIAGNOSIS — S90861A Insect bite (nonvenomous), right foot, initial encounter: Secondary | ICD-10-CM

## 2022-08-02 DIAGNOSIS — W57XXXA Bitten or stung by nonvenomous insect and other nonvenomous arthropods, initial encounter: Secondary | ICD-10-CM

## 2022-08-02 DIAGNOSIS — L089 Local infection of the skin and subcutaneous tissue, unspecified: Secondary | ICD-10-CM | POA: Diagnosis present

## 2022-08-02 DIAGNOSIS — R2241 Localized swelling, mass and lump, right lower limb: Secondary | ICD-10-CM

## 2022-08-02 LAB — BASIC METABOLIC PANEL
Anion gap: 9 (ref 5–15)
BUN: 10 mg/dL (ref 6–20)
CO2: 25 mmol/L (ref 22–32)
Calcium: 8.9 mg/dL (ref 8.9–10.3)
Chloride: 105 mmol/L (ref 98–111)
Creatinine, Ser: 0.72 mg/dL (ref 0.61–1.24)
GFR, Estimated: >60 mL/min (ref >60)
Glucose, Bld: 94 mg/dL (ref 70–99)
Potassium: 3.3 mmol/L — ABNORMAL LOW (ref 3.5–5.1)
Sodium: 139 mmol/L (ref 135–145)

## 2022-08-02 LAB — CBC
HCT: 45.3 % (ref 39.0–52.0)
Hemoglobin: 15.2 g/dL (ref 13.0–17.0)
MCH: 30.3 pg (ref 26.0–34.0)
MCHC: 33.6 g/dL (ref 30.0–36.0)
MCV: 90.4 fL (ref 80.0–100.0)
Platelets: 176 10*3/uL (ref 150–400)
RBC: 5.01 MIL/uL (ref 4.22–5.81)
RDW: 13.5 % (ref 11.5–15.5)
WBC: 15.7 10*3/uL — ABNORMAL HIGH (ref 4.0–10.5)
nRBC: 0 % (ref 0.0–0.2)

## 2022-08-02 LAB — CBC WITH DIFFERENTIAL/PLATELET
Abs Immature Granulocytes: 0.21 10*3/uL — ABNORMAL HIGH (ref 0.00–0.07)
Basophils Absolute: 0.1 10*3/uL (ref 0.0–0.1)
Basophils Relative: 1 %
Eosinophils Absolute: 0.6 10*3/uL — ABNORMAL HIGH (ref 0.0–0.5)
Eosinophils Relative: 3 %
HCT: 45.2 % (ref 39.0–52.0)
Hemoglobin: 15.5 g/dL (ref 13.0–17.0)
Immature Granulocytes: 1 %
Lymphocytes Relative: 26 %
Lymphs Abs: 4.4 10*3/uL — ABNORMAL HIGH (ref 0.7–4.0)
MCH: 30.8 pg (ref 26.0–34.0)
MCHC: 34.3 g/dL (ref 30.0–36.0)
MCV: 89.9 fL (ref 80.0–100.0)
Monocytes Absolute: 0.9 10*3/uL (ref 0.1–1.0)
Monocytes Relative: 5 %
Neutro Abs: 10.6 10*3/uL — ABNORMAL HIGH (ref 1.7–7.7)
Neutrophils Relative %: 64 %
Platelets: 190 10*3/uL (ref 150–400)
RBC: 5.03 MIL/uL (ref 4.22–5.81)
RDW: 13.4 % (ref 11.5–15.5)
WBC: 16.7 10*3/uL — ABNORMAL HIGH (ref 4.0–10.5)
nRBC: 0 % (ref 0.0–0.2)

## 2022-08-02 LAB — FIBRINOGEN
Fibrinogen: 322 mg/dL (ref 210–475)
Fibrinogen: 288 mg/dL (ref 210–475)
Fibrinogen: 331 mg/dL (ref 210–475)

## 2022-08-02 LAB — PROTIME-INR
INR: 1.1 (ref 0.8–1.2)
INR: 1.2 (ref 0.8–1.2)
INR: 1.1 (ref 0.8–1.2)
Prothrombin Time: 14.4 seconds (ref 11.4–15.2)
Prothrombin Time: 14.7 seconds (ref 11.4–15.2)
Prothrombin Time: 14.4 seconds (ref 11.4–15.2)

## 2022-08-02 LAB — D-DIMER, QUANTITATIVE
D-Dimer, Quant: 0.34 ug/mL-FEU (ref 0.00–0.50)
D-Dimer, Quant: 0.29 ug/mL-FEU (ref 0.00–0.50)

## 2022-08-02 LAB — APTT: aPTT: 35 seconds (ref 24–36)

## 2022-08-02 MED ORDER — SODIUM CHLORIDE 0.9% FLUSH
3.0000 mL | Freq: Two times a day (BID) | INTRAVENOUS | Status: DC
  Administered 2022-08-02 – 2022-08-05 (×7): 3 mL via INTRAVENOUS

## 2022-08-02 MED ORDER — ACETAMINOPHEN 650 MG RE SUPP: 650.0000 mg | Freq: Four times a day (QID) | RECTAL | Status: DC | PRN

## 2022-08-02 MED ORDER — SODIUM CHLORIDE 0.9% FLUSH: 3.0000 mL | INTRAVENOUS | Status: DC | PRN

## 2022-08-02 MED ORDER — SODIUM CHLORIDE 0.9 % IV SOLN
4.0000 | Freq: Once | INTRAVENOUS | Status: AC
  Administered 2022-08-02: 4 via INTRAVENOUS
  Filled 2022-08-02: qty 72

## 2022-08-02 MED ORDER — HYDROXYZINE HCL 25 MG PO TABS
25.0000 mg | ORAL_TABLET | Freq: Three times a day (TID) | ORAL | Status: DC | PRN
  Administered 2022-08-02 – 2022-08-05 (×9): 25 mg via ORAL
  Filled 2022-08-02 (×9): qty 1

## 2022-08-02 MED ORDER — TETANUS-DIPHTH-ACELL PERTUSSIS 5-2.5-18.5 LF-MCG/0.5 IM SUSY
0.5000 mL | PREFILLED_SYRINGE | Freq: Once | INTRAMUSCULAR | Status: AC
  Administered 2022-08-02: 0.5 mL via INTRAMUSCULAR
  Filled 2022-08-02: qty 0.5

## 2022-08-02 MED ORDER — POTASSIUM CHLORIDE CRYS ER 20 MEQ PO TBCR
40.0000 meq | EXTENDED_RELEASE_TABLET | Freq: Once | ORAL | Status: AC
  Administered 2022-08-02: 40 meq via ORAL
  Filled 2022-08-02: qty 2

## 2022-08-02 MED ORDER — HYDROMORPHONE HCL 1 MG/ML IJ SOLN
0.5000 mg | INTRAMUSCULAR | Status: DC | PRN
  Administered 2022-08-02 – 2022-08-03 (×7): 1 mg via INTRAVENOUS
  Administered 2022-08-03: 0.5 mg via INTRAVENOUS
  Administered 2022-08-03 – 2022-08-04 (×6): 1 mg via INTRAVENOUS
  Filled 2022-08-02 (×15): qty 1

## 2022-08-02 MED ORDER — MORPHINE SULFATE (PF) 4 MG/ML IV SOLN
4.0000 mg | Freq: Once | INTRAVENOUS | Status: AC
  Administered 2022-08-02: 4 mg via INTRAVENOUS
  Filled 2022-08-02: qty 1

## 2022-08-02 MED ORDER — SODIUM CHLORIDE 0.9 % IV SOLN: 250.0000 mL | INTRAVENOUS | Status: DC | PRN

## 2022-08-02 MED ORDER — SODIUM CHLORIDE 0.9 % IV SOLN
2.0000 | Freq: Once | INTRAVENOUS | Status: AC
  Administered 2022-08-02: 2 via INTRAVENOUS
  Filled 2022-08-02: qty 36

## 2022-08-02 MED ORDER — HYDROMORPHONE HCL 1 MG/ML IJ SOLN
1.0000 mg | Freq: Once | INTRAMUSCULAR | Status: AC
  Administered 2022-08-02: 1 mg via INTRAVENOUS
  Filled 2022-08-02: qty 1

## 2022-08-02 MED ORDER — OXYCODONE HCL 5 MG PO TABS
5.0000 mg | ORAL_TABLET | ORAL | Status: DC | PRN
  Administered 2022-08-03 – 2022-08-04 (×4): 5 mg via ORAL
  Filled 2022-08-02 (×6): qty 1

## 2022-08-02 MED ORDER — ACETAMINOPHEN 325 MG PO TABS: 650.0000 mg | ORAL_TABLET | Freq: Four times a day (QID) | ORAL | Status: DC | PRN

## 2022-08-02 MED ORDER — DIAZEPAM 5 MG/ML IJ SOLN
2.5000 mg | Freq: Once | INTRAMUSCULAR | Status: AC
  Administered 2022-08-02: 2.5 mg via INTRAVENOUS
  Filled 2022-08-02: qty 2

## 2022-08-02 MED ORDER — LACTATED RINGERS IV BOLUS
1000.0000 mL | Freq: Once | INTRAVENOUS | Status: AC
  Administered 2022-08-02: 1000 mL via INTRAVENOUS

## 2022-08-02 MED ORDER — NICOTINE 21 MG/24HR TD PT24
21.0000 mg | MEDICATED_PATCH | Freq: Every day | TRANSDERMAL | Status: DC
  Administered 2022-08-02 – 2022-08-05 (×4): 21 mg via TRANSDERMAL
  Filled 2022-08-02 (×5): qty 1

## 2022-08-02 MED ORDER — TRIAMTERENE-HCTZ 37.5-25 MG PO CAPS
1.0000 | ORAL_CAPSULE | Freq: Every day | ORAL | Status: DC
  Administered 2022-08-02: 1 via ORAL
  Filled 2022-08-02 (×2): qty 1

## 2022-08-02 NOTE — ED Notes (Signed)
Pt lower right ext elevated with pillows higher than the level of pt heart per MD verbal order.
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
## 2022-08-02 NOTE — ED Notes (Addendum)
[place] Poison Control Guideline for Monitoring of Snake Bites - Foot/Leg Circumferential Wound Measurements in Centimeters (cm) Foot 28 cm Ankle 25 cm Calf 36 cm Thigh 50 cm Groin Tenderness? No

## 2022-08-02 NOTE — ED Notes (Addendum)
Right lower ext measurements  Foot- 28 cm Ankle- 25 cm Calf- 36 cm Thigh- 50 cm Groin Tenderness?- No  RN also notified provider about pt rt lower ext more warm to the touch than earlier. Pt is afebrile.

## 2022-08-02 NOTE — ED Notes (Signed)
[place] Poison Control Guideline for Monitoring of Snake Bites - Foot/Leg Circumferential Wound Measurements in Centimeters (cm) Second Assessment on 08/02/2022 at 0305 Foot 27 cm Ankle 25 cm Calf 36 cm Thigh 49 cm Groin Tenderness? No    First Assessment on 08/02/2022 at [place] 28 cm Ankle 25 cm Calf 36 cm Thigh 50 cm Groin Tenderness? No

## 2022-08-02 NOTE — ED Notes (Signed)
Right leg measurements  Foot- 26.5 cm Ankle- 25 cm Calf- 36 cm Thigh- 51 cm  Groin Tenderness?- No

## 2022-08-02 NOTE — Progress Notes (Signed)
Pt arrived to room 225 via stretcher from the ED. Received report from [place], Therapist, sports. See assessment. Will continue to monitor.

## 2022-08-02 NOTE — ED Notes (Signed)
Right lower ext measurements  Foot- 27 cm Ankle- 25.5 cm Calf- 36 cm Thigh- 51.5 cm

## 2022-08-02 NOTE — ED Notes (Signed)
Hospitalist at bedside at this time 

## 2022-08-02 NOTE — ED Notes (Addendum)
RN re assessed pt right leg again. Foot- 27 cm Ankle- 25 1/2 cm Calf- 36 cm Thigh- 49 cm Groin Tenderness?- No

## 2022-08-02 NOTE — ED Notes (Signed)
[place] Poison Control Guideline for Monitoring of Snake Bites - Foot/Leg Circumferential Wound Measurements in Centimeters (cm) Third Assessment on 08/02/2022 at 0405 Foot 27 cm Ankle 25 cm Calf 35.5 cm Thigh 49 cm Groin Tenderness? No   Second Assessment on 08/02/2022 at 0305 Foot 27 cm Ankle 25 cm Calf 36 cm Thigh 49 cm Groin Tenderness? No   First Assessment on 08/02/2022 at [place] 28 cm Ankle 25 cm Calf 36 cm Thigh 50 cm Groin Tenderness? No
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
## 2022-08-02 NOTE — ED Notes (Addendum)
Pt yelling in hallway. Pt sts " I am ready to get out of here. I just need some crutches. I need to pee, I also needs pain meds my leg hurts, its still swelling and no one has even looked at it. I have been here for 4 hrs and nothing has been done." RN assesed pt leg and compaired to previous RN measurements. Pt leg is not swelling more than previously documented. RN notified Dr Si Raider about pt decision on wanting to leave. RN provided pt with urinal and will be asking provider for pain med order.

## 2022-08-02 NOTE — ED Notes (Signed)
RN contacted hospitalist, Dr Si Raider, for admit orders for pt with pain control. Per MD, he will be down to see the pt when he can.

## 2022-08-02 NOTE — ED Notes (Addendum)
Right Lower Ext measurements  Foot- 27 1/2 cm Ankle- 25 cm Calf- 36 1/2 cm Thigh- 49 cm Groin Tenderness?- No

## 2022-08-03 DIAGNOSIS — T148XXA Other injury of unspecified body region, initial encounter: Secondary | ICD-10-CM | POA: Diagnosis not present

## 2022-08-03 DIAGNOSIS — H8109 Meniere's disease, unspecified ear: Secondary | ICD-10-CM | POA: Diagnosis present

## 2022-08-03 DIAGNOSIS — Z888 Allergy status to other drugs, medicaments and biological substances status: Secondary | ICD-10-CM | POA: Diagnosis not present

## 2022-08-03 DIAGNOSIS — Z880 Allergy status to penicillin: Secondary | ICD-10-CM | POA: Diagnosis not present

## 2022-08-03 DIAGNOSIS — F1010 Alcohol abuse, uncomplicated: Secondary | ICD-10-CM | POA: Diagnosis present

## 2022-08-03 DIAGNOSIS — M79671 Pain in right foot: Secondary | ICD-10-CM | POA: Diagnosis present

## 2022-08-03 DIAGNOSIS — T63091A Toxic effect of venom of other snake, accidental (unintentional), initial encounter: Secondary | ICD-10-CM | POA: Diagnosis present

## 2022-08-03 DIAGNOSIS — D72829 Elevated white blood cell count, unspecified: Secondary | ICD-10-CM | POA: Diagnosis present

## 2022-08-03 DIAGNOSIS — F429 Obsessive-compulsive disorder, unspecified: Secondary | ICD-10-CM | POA: Diagnosis present

## 2022-08-03 DIAGNOSIS — H9193 Unspecified hearing loss, bilateral: Secondary | ICD-10-CM | POA: Diagnosis present

## 2022-08-03 DIAGNOSIS — Z79899 Other long term (current) drug therapy: Secondary | ICD-10-CM | POA: Diagnosis not present

## 2022-08-03 DIAGNOSIS — E876 Hypokalemia: Secondary | ICD-10-CM | POA: Diagnosis present

## 2022-08-03 DIAGNOSIS — Z818 Family history of other mental and behavioral disorders: Secondary | ICD-10-CM | POA: Diagnosis not present

## 2022-08-03 DIAGNOSIS — Z23 Encounter for immunization: Secondary | ICD-10-CM | POA: Diagnosis not present

## 2022-08-03 DIAGNOSIS — R7303 Prediabetes: Secondary | ICD-10-CM | POA: Diagnosis present

## 2022-08-03 DIAGNOSIS — F17210 Nicotine dependence, cigarettes, uncomplicated: Secondary | ICD-10-CM | POA: Diagnosis present

## 2022-08-03 DIAGNOSIS — E7801 Familial hypercholesterolemia: Secondary | ICD-10-CM | POA: Diagnosis present

## 2022-08-03 DIAGNOSIS — L089 Local infection of the skin and subcutaneous tissue, unspecified: Secondary | ICD-10-CM | POA: Diagnosis present

## 2022-08-03 DIAGNOSIS — G4730 Sleep apnea, unspecified: Secondary | ICD-10-CM | POA: Diagnosis present

## 2022-08-03 LAB — COMPREHENSIVE METABOLIC PANEL
ALT: 57 U/L — ABNORMAL HIGH (ref 0–44)
AST: 42 U/L — ABNORMAL HIGH (ref 15–41)
Albumin: 3.7 g/dL (ref 3.5–5.0)
Alkaline Phosphatase: 91 U/L (ref 38–126)
Anion gap: 8 (ref 5–15)
BUN: 9 mg/dL (ref 6–20)
CO2: 28 mmol/L (ref 22–32)
Calcium: 8.7 mg/dL — ABNORMAL LOW (ref 8.9–10.3)
Chloride: 102 mmol/L (ref 98–111)
Creatinine, Ser: 0.68 mg/dL (ref 0.61–1.24)
GFR, Estimated: >60 mL/min (ref >60)
Glucose, Bld: 100 mg/dL — ABNORMAL HIGH (ref 70–99)
Potassium: 3.5 mmol/L (ref 3.5–5.1)
Sodium: 138 mmol/L (ref 135–145)
Total Bilirubin: 1.1 mg/dL (ref 0.3–1.2)
Total Protein: 6.7 g/dL (ref 6.5–8.1)

## 2022-08-03 LAB — FIBRINOGEN: Fibrinogen: 409 mg/dL (ref 210–475)

## 2022-08-03 LAB — CBC
HCT: 46.3 % (ref 39.0–52.0)
Hemoglobin: 15.4 g/dL (ref 13.0–17.0)
MCH: 30 pg (ref 26.0–34.0)
MCHC: 33.3 g/dL (ref 30.0–36.0)
MCV: 90.3 fL (ref 80.0–100.0)
Platelets: 172 10*3/uL (ref 150–400)
RBC: 5.13 MIL/uL (ref 4.22–5.81)
RDW: 13.5 % (ref 11.5–15.5)
WBC: 12.2 10*3/uL — ABNORMAL HIGH (ref 4.0–10.5)
nRBC: 0 % (ref 0.0–0.2)

## 2022-08-03 LAB — PROTIME-INR
INR: 1.1 (ref 0.8–1.2)
Prothrombin Time: 13.9 seconds (ref 11.4–15.2)

## 2022-08-03 LAB — HIV ANTIBODY (ROUTINE TESTING W REFLEX): HIV Screen 4th Generation wRfx: NONREACTIVE

## 2022-08-03 LAB — APTT: aPTT: 35 seconds (ref 24–36)

## 2022-08-03 MED ORDER — POLYVINYL ALCOHOL 1.4 % OP SOLN
1.0000 [drp] | OPHTHALMIC | Status: DC | PRN
  Administered 2022-08-03: 1 [drp] via OPHTHALMIC
  Filled 2022-08-03: qty 15

## 2022-08-03 MED ORDER — TRIAMTERENE-HCTZ 37.5-25 MG PO TABS
1.0000 | ORAL_TABLET | Freq: Every day | ORAL | Status: DC
  Administered 2022-08-03 – 2022-08-05 (×3): 1 via ORAL
  Filled 2022-08-03 (×4): qty 1

## 2022-08-03 NOTE — Progress Notes (Signed)
This Probation officer measured pt's R leg Q4 @ (1900/ 0000/ & 0400)   The following measurements below did not change during the duration of my shift   FOOT: 27.5 cm ANKLE: 25 cm CALF: 37 cm THIGH: 51 cm   Information was passed off to next on-coming primary RN Jun.

## 2022-08-03 NOTE — Progress Notes (Signed)
Measurement: Foot 27cm Ankle 25cm Calf 37cm Thigh 50cm   All measurements are the same except the Thigh, it went down from 51cm to 50cm now. Leg kept elevated.

## 2022-08-03 NOTE — TOC Initial Note (Signed)
Transition of Care [facility]) - Initial/Assessment Note    Patient Details  Name: Lance Foley MRN: [id number] Date of Birth: Jul 18, 1986  Transition of Care [facility]) CM/SW Contact:    Beverly Sessions, RN Phone Number: 08/03/2022, 3:43 PM  Clinical Narrative:                  Transition of Care [facility]) Screening Note   Patient Details  Name: Lance Foley Date of Birth: 1986/05/16   Transition of Care [facility]) CM/SW Contact:    Beverly Sessions, RN Phone Number: 08/03/2022, 3:43 PM    Transition of Care Department [facility]) has reviewed patient and no TOC needs have been identified at this time. We will continue to monitor patient advancement through interdisciplinary progression rounds. If new patient transition needs arise, please place a TOC consult.          Patient Goals and CMS Choice        Expected Discharge Plan and Services                                                Prior Living Arrangements/Services                       Activities of Daily Living      Permission Sought/Granted                  Emotional Assessment              Admission diagnosis:  Hypokalemia [E87.6] Infected wound [T14.8XXA, L08.9] Intractable pain [R52] Insect bite of right foot, initial encounter [L57.262M, W57.XXXA] Localized swelling of right foot [R22.41] Patient Active Problem List   Diagnosis Date Noted   Infected wound 08/03/2022   Bite 08/02/2022   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   Finger numbness 06/08/2022   Frequent urination 06/08/2022   Elevated BP without diagnosis of hypertension 35/59/7416   Umbilical hernia 38/45/3646   Obesity (BMI 30-39.9) 08/12/2019   Alcohol abuse 09/17/2018   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: [phone]: [phone]    Social Determinants of Health (SDOH) Interventions    Readmission Risk Interventions     No data to display

## 2022-08-03 NOTE — Progress Notes (Signed)
PROGRESS NOTE    Lance Foley  LGX:[id number] DOB: 1986-01-18 DOA: 08/02/2022 PCP: [name], MD    Brief Narrative:  This 36 years old male with PMH significant for alcohol abuse, Mnire's disease presented in the ED with suspected coppehead bite to the right foot.  Patient was outside last evening wearing flip-flops when he felt a sharp bite sensation in the right lateral foot area.  He has checked with a light but did not find anything.  He has experienced significant pain and swelling in the right foot.  He has also noticed the swelling was extending and involving the right leg.  Patient was evaluated in the ED.  He was admitted for further evaluation.  Poison control was notified patient was given CroFab.  Patient report improvement in the swelling and the pain.  Assessment & Plan:   Principal Problem:   Bite Active Problems:   Obsessive-compulsive disorder   Smoker   Meniere's disease   Alcohol abuse  Snake bite probably copperhead: Patient presented with sharp bite causing pain last evening in the dark. He has developed severe pain and swelling which is extending to involve the right leg. Poison control was notified , given diffuse worsening of swelling they advised treating for possible copperhead or other venoomous snake bites. Coag profile is within normal limits. Patient has received Tdap. He has received 2 units of  CroFab infusion Check PT/ PTT/ fibrinogen 3 hours after finishing CroFab infusion.   No NSAIDs or morphine per poison control. X-ray of the right foot no foreign body noted.  EtOH abuse: Denies any signs of withdrawal. Continue to monitor  Mnire's disease: Continue home triamterene/HCTZ  Leukocytosis: Likely reactive in the setting of bite. Continue to monitor  Elevated liver enzymes: Likely due to above.  Continue to monitor  DVT prophylaxis: SCDs Code Status:Full code. Family Communication: No family at bedside Disposition Plan:    Status is: Observation The patient remains OBS appropriate and will d/c before 2 midnights.  Patient admitted for possible copperhead snake bite requiring CroFab antivenom infusion.  Right leg swelling is improving.   Consultants:  None  Procedures: None Antimicrobials: None  Subjective: Patient was seen and examined at bedside.  Overnight events noted.   Patient reports feeling better,  right leg is still swollen and appears shiny, bite marks noted in the right lateral foot.   Patient reports improvement in pain.  Objective: Vitals:   08/02/22 1841 08/02/22 1944 08/03/22 0413 08/03/22 0904  BP: 131/86 (!) 140/98 (!) 128/90 120/80  Pulse: 92 87 89   Resp: '19 17 20 '$ (!) 21  Temp: 98 F (36.7 C) 97.6 F (36.4 C) 98 F (36.7 C) 98.1 F (36.7 C)  TempSrc: Oral Oral Oral Oral  SpO2: 98% 99% 98% 97%  Weight:      Height:        Intake/Output Summary (Last 24 hours) at 08/03/2022 1256 Last data filed at 08/03/2022 1100 Gross per 24 hour  Intake 420 ml  Output 2000 ml  Net -1580 ml   Filed Weights   08/01/22 2217 08/01/22 2229  Weight: 93 kg 93 kg    Examination:  General exam: Appears comfortable, not in any acute distress. Respiratory system: CTA bilaterally, no wheezing, no crackles, normal respiratory effort. Cardiovascular system: S1 & S2 heard, regular rate and rhythm, no murmur. Gastrointestinal system: Abdomen is soft, non tender, non distended, BS+. Central nervous system: Alert and oriented x3. No focal neurological deficits. Extremities: Right leg  swollen, shining, bite marks noted in the right lateral foot.  Swelling improving. Skin: No rashes, lesions or ulcers Psychiatry: Judgement and insight appear normal. Mood & affect appropriate.     Data Reviewed: I have personally reviewed following labs and imaging studies  CBC: Recent Labs  Lab 08/01/22 2236 08/02/22 0427 08/02/22 1635 08/03/22 0709  WBC 17.3* 16.7* 15.7* 12.2*  NEUTROABS 11.3* 10.6*   --   --   HGB 16.7 15.5 15.2 15.4  HCT 48.3 45.2 45.3 46.3  MCV 89.1 89.9 90.4 90.3  PLT 208 190 176 676   Basic Metabolic Panel: Recent Labs  Lab 08/01/22 2236 08/02/22 0427 08/03/22 0709  NA 137 139 138  K 3.3* 3.3* 3.5  CL 100 105 102  CO2 '22 25 28  '$ GLUCOSE 99 94 100*  BUN '11 10 9  '$ CREATININE 0.76 0.72 0.68  CALCIUM 9.6 8.9 8.7*   GFR: Estimated Creatinine Clearance: 141 mL/min (by C-G formula based on SCr of 0.68 mg/dL). Liver Function Tests: Recent Labs  Lab 08/01/22 2236 08/03/22 0709  AST 36 42*  ALT 54* 57*  ALKPHOS 114 91  BILITOT 1.4* 1.1  PROT 7.7 6.7  ALBUMIN 4.4 3.7   No results for input(s): "LIPASE", "AMYLASE" in the last 168 hours. No results for input(s): "AMMONIA" in the last 168 hours. Coagulation Profile: Recent Labs  Lab 08/02/22 0118 08/02/22 0427 08/02/22 1635 08/03/22 0709  INR 1.1 1.2 1.1 1.1   Cardiac Enzymes: No results for input(s): "CKTOTAL", "CKMB", "CKMBINDEX", "TROPONINI" in the last 168 hours. BNP (last 3 results) No results for input(s): "PROBNP" in the last 8760 hours. HbA1C: No results for input(s): "HGBA1C" in the last 72 hours. CBG: No results for input(s): "GLUCAP" in the last 168 hours. Lipid Profile: No results for input(s): "CHOL", "HDL", "LDLCALC", "TRIG", "CHOLHDL", "LDLDIRECT" in the last 72 hours. Thyroid Function Tests: No results for input(s): "TSH", "T4TOTAL", "FREET4", "T3FREE", "THYROIDAB" in the last 72 hours. Anemia Panel: No results for input(s): "VITAMINB12", "FOLATE", "FERRITIN", "TIBC", "IRON", "RETICCTPCT" in the last 72 hours. Sepsis Labs: No results for input(s): "PROCALCITON", "LATICACIDVEN" in the last 168 hours.  No results found for this or any previous visit (from the past 240 hour(s)).    Radiology Studies: DG Foot 2 Views Right  Result Date: 08/02/2022 CLINICAL DATA:  Swelling and pain EXAM: RIGHT FOOT - 2 VIEW COMPARISON:  None Available. FINDINGS: Normal alignment. No acute  fracture. Normal mineralization. The soft tissues are unremarkable. IMPRESSION: No acute osseous abnormality. Electronically Signed   By: Albin Felling M.D.   On: 08/02/2022 10:16     Scheduled Meds:  nicotine  21 mg Transdermal Daily   sodium chloride flush  3 mL Intravenous Q12H   triamterene-hydrochlorothiazide  1 tablet Oral Daily   Continuous Infusions:  sodium chloride       LOS: 0 days    Time spent: 50 mins    Lance Pierron, MD [facility]   If 7PM-7AM, please contact night-coverage

## 2022-08-04 DIAGNOSIS — T148XXA Other injury of unspecified body region, initial encounter: Secondary | ICD-10-CM | POA: Diagnosis not present

## 2022-08-04 LAB — COMPREHENSIVE METABOLIC PANEL
ALT: 67 U/L — ABNORMAL HIGH (ref 0–44)
AST: 42 U/L — ABNORMAL HIGH (ref 15–41)
Albumin: 3.8 g/dL (ref 3.5–5.0)
Alkaline Phosphatase: 97 U/L (ref 38–126)
Anion gap: 7 (ref 5–15)
BUN: 12 mg/dL (ref 6–20)
CO2: 30 mmol/L (ref 22–32)
Calcium: 8.9 mg/dL (ref 8.9–10.3)
Chloride: 100 mmol/L (ref 98–111)
Creatinine, Ser: 0.83 mg/dL (ref 0.61–1.24)
GFR, Estimated: >60 mL/min (ref >60)
Glucose, Bld: 97 mg/dL (ref 70–99)
Potassium: 3.5 mmol/L (ref 3.5–5.1)
Sodium: 137 mmol/L (ref 135–145)
Total Bilirubin: 1 mg/dL (ref 0.3–1.2)
Total Protein: 6.9 g/dL (ref 6.5–8.1)

## 2022-08-04 LAB — CBC
HCT: 45.6 % (ref 39.0–52.0)
Hemoglobin: 15.3 g/dL (ref 13.0–17.0)
MCH: 30.8 pg (ref 26.0–34.0)
MCHC: 33.6 g/dL (ref 30.0–36.0)
MCV: 91.8 fL (ref 80.0–100.0)
Platelets: 154 10*3/uL (ref 150–400)
RBC: 4.97 MIL/uL (ref 4.22–5.81)
RDW: 13.6 % (ref 11.5–15.5)
WBC: 13.8 10*3/uL — ABNORMAL HIGH (ref 4.0–10.5)
nRBC: 0 % (ref 0.0–0.2)

## 2022-08-04 LAB — PHOSPHORUS: Phosphorus: 4.5 mg/dL (ref 2.5–4.6)

## 2022-08-04 LAB — MAGNESIUM: Magnesium: 2.1 mg/dL (ref 1.7–2.4)

## 2022-08-04 MED ORDER — WHITE PETROLATUM EX OINT
TOPICAL_OINTMENT | CUTANEOUS | Status: DC | PRN
  Filled 2022-08-04: qty 5

## 2022-08-04 MED ORDER — OXYCODONE HCL 5 MG PO TABS
7.5000 mg | ORAL_TABLET | ORAL | Status: DC | PRN
  Administered 2022-08-04 – 2022-08-05 (×4): 7.5 mg via ORAL
  Filled 2022-08-04 (×4): qty 2

## 2022-08-04 MED ORDER — OXYCODONE HCL 7.5 MG PO TABS: 7.5000 mg | ORAL_TABLET | ORAL | 0 refills | Status: DC | PRN

## 2022-08-04 NOTE — Progress Notes (Signed)
Mobility Specialist - Progress Note   08/04/22 1554  Mobility  Activity Ambulated independently in hallway  Level of Assistance Modified independent, requires aide device or extra time  Assistive Device Front wheel walker  Distance Ambulated (ft) 200 ft  Activity Response Tolerated well  $Mobility charge 1 Mobility   Pt standing at the doorway upon entry, utilizing RA. Pt ambulated one lap around NS using RW, toe touch weight bearing on right leg. Pt left supine with needs within reach, no complaints.   Candie Mile Mobility Specialist 08/04/22 4:03 PM

## 2022-08-04 NOTE — Discharge Instructions (Signed)
Advised to follow-up with primary care physician in 1 week. Advised to keep leg elevated.  Advised to take pain medication is needed.

## 2022-08-04 NOTE — Progress Notes (Signed)
Patient remains alert and oriented x4. R-extremity remains elevated and swelling has decreased. Poison control called and update was given to them. Patient stated that he would ask MD to increase dose of oxycodone. Has been getting dilaudid and oxy for pain. Gave patient a sandwich tray this morning due to him stating that he was very hungry. He denied additional needs.

## 2022-08-04 NOTE — Progress Notes (Signed)
PROGRESS NOTE    Lance Foley  XTK:[id number] DOB: Mar 08, 1986 DOA: 08/02/2022 PCP: [name], MD    Brief Narrative:  This 36 years old male with PMH significant for alcohol abuse, Mnire's disease presented in the ED with suspected coppehead bite to the right foot.  Patient was outside last evening wearing flip-flops when he felt a sharp bite sensation in the right lateral foot area.  He has checked with a light but did not find anything.  He has experienced significant pain and swelling in the right foot.  He has also noticed the swelling was extending and involving the right leg.  Patient was evaluated in the ED.  He was admitted for further evaluation.  Poison control was notified patient was given CroFab.  Patient report improvement in the swelling and the pain.  Assessment & Plan:   Principal Problem:   Bite Active Problems:   Obsessive-compulsive disorder   Smoker   Meniere's disease   Alcohol abuse   Infected wound  Snake bite probably copperhead: Patient presented with sharp bite in the right foot causing pain last evening in the dark. He has developed severe pain and swelling which is extending to involve the right leg. Poison control was notified , given diffuse worsening of swelling they advised treating for possible copperhead or other venomous snake bites. Coag profile is within normal limits. Patient has received Tdap. He has received 2 units of  CroFab infusion. He tolerated well. Check PT/ PTT/ fibrinogen 3 hours after finishing CroFab infusion.   No NSAIDs or morphine per poison control. X-ray of the right foot no foreign body noted. Patient still has persistent swelling but improving.  Affected extremity is warm. Advised leg elevation stated patient in a lot of pain when he put weight. Increase oxycodone to 7.5 mg every 6 hours as needed.  EtOH abuse: Denies any signs of withdrawal. Continue to monitor  Mnire's disease: Continue home  triamterene/HCTZ  Leukocytosis: Likely reactive in the setting of bite. Continue to monitor  Elevated liver enzymes: Likely due to above.  Continue to monitor  DVT prophylaxis: SCDs Code Status:Full code. Family Communication: No family at bedside Disposition Plan:   Status is: Inpatient Remains inpatient appropriate because:    Patient admitted for possible copperhead snake bite requiring CroFab antivenom infusion.  Right leg swelling is improving.   Consultants:  None  Procedures: None Antimicrobials: None  Subjective: Patient was seen and examined at bedside.  Overnight events noted. Patient reports feeling better right leg is still swollen but improved. bite marks noted in the right lateral foot.   Patient reported worsening pain when he put weight on the right leg.  Objective: Vitals:   08/03/22 0413 08/03/22 0904 08/04/22 0414 08/04/22 0800  BP: (!) 128/90 120/80 (!) 144/93 121/76  Pulse: 89  80 87  Resp: 20 (!) '21 18 18  '$ Temp: 98 F (36.7 C) 98.1 F (36.7 C) 97.7 F (36.5 C) 98 F (36.7 C)  TempSrc: Oral Oral Oral Oral  SpO2: 98% 97% 100% 96%  Weight:      Height:        Intake/Output Summary (Last 24 hours) at 08/04/2022 1411 Last data filed at 08/04/2022 0800 Gross per 24 hour  Intake 1260 ml  Output 1450 ml  Net -190 ml   Filed Weights   08/01/22 2217 08/01/22 2229  Weight: 93 kg 93 kg    Examination:  General exam: Appears comfortable, not in any acute distress Respiratory system:  CTA bilaterally, respiratory effort normal, RR 15 Cardiovascular system: S1 & S2 heard, regular rate and rhythm, no murmur. Gastrointestinal system: Abdomen is soft, non tender, non distended, BS+ Central nervous system: Alert and oriented x3. No focal neurological deficits. Extremities: Right leg swollen, improving, warmth, tender,  bite marks noted in the right lateral foot.  Skin: No rashes, lesions or ulcers Psychiatry: Judgement and insight appear normal.  Mood & affect appropriate.     Data Reviewed: I have personally reviewed following labs and imaging studies  CBC: Recent Labs  Lab 08/01/22 2236 08/02/22 0427 08/02/22 1635 08/03/22 0709 08/04/22 0419  WBC 17.3* 16.7* 15.7* 12.2* 13.8*  NEUTROABS 11.3* 10.6*  --   --   --   HGB 16.7 15.5 15.2 15.4 15.3  HCT 48.3 45.2 45.3 46.3 45.6  MCV 89.1 89.9 90.4 90.3 91.8  PLT 208 190 176 172 998   Basic Metabolic Panel: Recent Labs  Lab 08/01/22 2236 08/02/22 0427 08/03/22 0709 08/04/22 0419  NA 137 139 138 137  K 3.3* 3.3* 3.5 3.5  CL 100 105 102 100  CO2 '22 25 28 30  '$ GLUCOSE 99 94 100* 97  BUN '11 10 9 12  '$ CREATININE 0.76 0.72 0.68 0.83  CALCIUM 9.6 8.9 8.7* 8.9  MG  --   --   --  2.1  PHOS  --   --   --  4.5   GFR: Estimated Creatinine Clearance: 135.9 mL/min (by C-G formula based on SCr of 0.83 mg/dL). Liver Function Tests: Recent Labs  Lab 08/01/22 2236 08/03/22 0709 08/04/22 0419  AST 36 42* 42*  ALT 54* 57* 67*  ALKPHOS 114 91 97  BILITOT 1.4* 1.1 1.0  PROT 7.7 6.7 6.9  ALBUMIN 4.4 3.7 3.8   No results for input(s): "LIPASE", "AMYLASE" in the last 168 hours. No results for input(s): "AMMONIA" in the last 168 hours. Coagulation Profile: Recent Labs  Lab 08/02/22 0118 08/02/22 0427 08/02/22 1635 08/03/22 0709  INR 1.1 1.2 1.1 1.1   Cardiac Enzymes: No results for input(s): "CKTOTAL", "CKMB", "CKMBINDEX", "TROPONINI" in the last 168 hours. BNP (last 3 results) No results for input(s): "PROBNP" in the last 8760 hours. HbA1C: No results for input(s): "HGBA1C" in the last 72 hours. CBG: No results for input(s): "GLUCAP" in the last 168 hours. Lipid Profile: No results for input(s): "CHOL", "HDL", "LDLCALC", "TRIG", "CHOLHDL", "LDLDIRECT" in the last 72 hours. Thyroid Function Tests: No results for input(s): "TSH", "T4TOTAL", "FREET4", "T3FREE", "THYROIDAB" in the last 72 hours. Anemia Panel: No results for input(s): "VITAMINB12", "FOLATE",  "FERRITIN", "TIBC", "IRON", "RETICCTPCT" in the last 72 hours. Sepsis Labs: No results for input(s): "PROCALCITON", "LATICACIDVEN" in the last 168 hours.  No results found for this or any previous visit (from the past 240 hour(s)).    Radiology Studies: No results found.   Scheduled Meds:  nicotine  21 mg Transdermal Daily   sodium chloride flush  3 mL Intravenous Q12H   triamterene-hydrochlorothiazide  1 tablet Oral Daily   Continuous Infusions:  sodium chloride       LOS: 1 day    Time spent: 35 mins    Dontae Minerva, MD [facility]   If 7PM-7AM, please contact night-coverage

## 2022-08-04 NOTE — Discharge Summary (Signed)
Physician Discharge Summary  Lance Foley FTD:[id number] DOB: Jun 26, 1986 DOA: 08/02/2022  PCP: [name], MD  Admit date: 08/02/2022  Discharge date: 08/05/2022  Admitted From: Home.  Disposition: Home.  Recommendations for Outpatient Follow-up:  Follow up with PCP in 1-2 weeks. Please obtain BMP/CBC in one week. Advised to keep leg elevated.  Advised to take pain medications as needed.  Home Health: None Equipment/Devices: None  Discharge Condition: Stable CODE STATUS:Full code Diet recommendation: Heart Healthy   Brief [facility] Course: This 35 years old male with PMH significant for alcohol abuse, Mnire's disease presented in the ED with suspected coppehead bite to the right foot.  Patient was outside last evening wearing flip-flops when he felt a sharp bite sensation in the right lateral foot area.  He has checked with a torch light but did not find anything. He has experienced significant pain and swelling in the right foot.  He has also noticed the swelling was extending and involving the right leg.  Patient was evaluated in the ED and  was admitted for further evaluation.  Poison control was notified.  Patient was given CroFab 2 Ampoules.  Patient was managed with supportive care,  IV hydration , pain control and leg elevation.  Swelling has significantly improved and reduced.  Patient continued to have pain.  Patient felt better, and wants to be discharged.  There was no further recommendation from poison control.  Patient is being discharged home, advised to take rest from work and return to ED if is worsening symptoms.  Discharge Diagnoses:  Principal Problem:   Bite Active Problems:   Obsessive-compulsive disorder   Smoker   Meniere's disease   Alcohol abuse   Infected wound  Snake bite probably copperhead: Patient presented with sharp bite causing pain last evening in the dark. He has developed severe pain and swelling which is extending to involve the  right leg. Poison control was notified , given diffuse worsening of swelling they advised treating for possible copperhead or other venomous snake bites. Coag profile is within normal limits. Patient has received Tdap. He has received 2 units of  CroFab infusion Check PT/ PTT/ fibrinogen remains normal after CroFab infusion.   No NSAIDs or morphine per poison control. X-ray of the right foot no foreign body noted. Swelling has much improved. Patient is being discharged home   EtOH abuse: Denies any signs of withdrawal. Continue to monitor   Mnire's disease: Continue home triamterene/HCTZ   Leukocytosis:> Resolved. Likely reactive in the setting of bite. Continue to monitor   Elevated liver enzymes: Likely due to above.  Continue to monitor  Discharge Instructions  Discharge Instructions     Call MD for:  persistant dizziness or light-headedness   Complete by: As directed    Call MD for:  persistant nausea and vomiting   Complete by: As directed    Call MD for:  redness, tenderness, or signs of infection (pain, swelling, redness, odor or green/yellow discharge around incision site)   Complete by: As directed    Diet - low sodium heart healthy   Complete by: As directed    Diet Carb Modified   Complete by: As directed    Discharge instructions   Complete by: As directed    Advised to follow-up with primary care physician in 1 week. Advised to keep leg elevated.  Advised to take pain medication is needed.   Increase activity slowly   Complete by: As directed    No wound  care   Complete by: As directed    No wound care   Complete by: As directed       Allergies as of 08/05/2022       Reactions   Chantix [varenicline] Other (See Comments)   intolerant   Penicillins    REACTION: RASH (any "cillins" meds   Wellbutrin [bupropion] Hives   Unsure if this was the cause         Medication List     STOP taking these medications    rosuvastatin 5 MG  tablet Commonly known as: Crestor       TAKE these medications    Fish Oil 1000 MG Caps Take 1 capsule by mouth daily.   ONE-A-DAY MENS PO Take by mouth.   oxyCODONE 5 MG immediate release tablet Commonly known as: Roxicodone Take 1 tablet (5 mg total) by mouth every 8 (eight) hours as needed.   triamterene-hydrochlorothiazide 37.5-25 MG capsule Commonly known as: DYAZIDE Take 1 capsule by mouth daily.        Follow-up Information     Tower, Wynelle Fanny, MD Follow up in 1 week(s).   Specialties: Family Medicine, Radiology Why: CALL TO MAKE APPT Contact information: [place] [address] [phone]                Allergies  Allergen Reactions   Chantix [Varenicline] Other (See Comments)    intolerant   Penicillins     REACTION: RASH (any "cillins" meds   Wellbutrin [Bupropion] Hives    Unsure if this was the cause     Consultations: None   Procedures/Studies: DG Foot 2 Views Right  Result Date: 08/02/2022 CLINICAL DATA:  Swelling and pain EXAM: RIGHT FOOT - 2 VIEW COMPARISON:  None Available. FINDINGS: Normal alignment. No acute fracture. Normal mineralization. The soft tissues are unremarkable. IMPRESSION: No acute osseous abnormality. Electronically Signed   By: Albin Felling M.D.   On: 08/02/2022 10:16    Subjective: Patient was seen and examined at bedside.  Overnight events noted.   Patient reports feeling much improved.  Right leg swelling has significantly improved.   Patient still reports having pain while walking.  Discharge Exam: Vitals:   08/05/22 0218 08/05/22 0750  BP: 122/77 121/85  Pulse: 74 78  Resp: 20 20  Temp: 98.1 F (36.7 C) 98.4 F (36.9 C)  SpO2: 96% 97%   Vitals:   08/04/22 1603 08/04/22 2021 08/05/22 0218 08/05/22 0750  BP: (!) 152/97 (!) 147/91 122/77 121/85  Pulse: (!) 106 92 74 78  Resp: '18 20 20 20  '$ Temp: 97.6 F (36.4 C) 97.9 F (36.6 C) 98.1 F (36.7 C) 98.4 F (36.9 C)   TempSrc: Oral Oral Oral   SpO2: 95% 100% 96% 97%  Weight:      Height:        General: Pt is alert, awake, not in acute distress Cardiovascular: RRR, S1/S2 +, no rubs, no gallops Respiratory: CTA bilaterally, no wheezing, no rhonchi Abdominal: Soft, NT, ND, bowel sounds + Extremities: Right leg swollen, tender, but improving    The results of significant diagnostics from this hospitalization (including imaging, microbiology, ancillary and laboratory) are listed below for reference.     Microbiology: No results found for this or any previous visit (from the past 240 hour(s)).   Labs: BNP (last 3 results) No results for input(s): "BNP" in the last 8760 hours. Basic Metabolic Panel: Recent Labs  Lab 08/01/22 2236 08/02/22 0427  08/03/22 0709 08/04/22 0419  NA 137 139 138 137  K 3.3* 3.3* 3.5 3.5  CL 100 105 102 100  CO2 '22 25 28 30  '$ GLUCOSE 99 94 100* 97  BUN '11 10 9 12  '$ CREATININE 0.76 0.72 0.68 0.83  CALCIUM 9.6 8.9 8.7* 8.9  MG  --   --   --  2.1  PHOS  --   --   --  4.5   Liver Function Tests: Recent Labs  Lab 08/01/22 2236 08/03/22 0709 08/04/22 0419 08/05/22 0704  AST 36 42* 42* 42*  ALT 54* 57* 67* 69*  ALKPHOS 114 91 97 89  BILITOT 1.4* 1.1 1.0 0.8  PROT 7.7 6.7 6.9 6.8  ALBUMIN 4.4 3.7 3.8 3.7   No results for input(s): "LIPASE", "AMYLASE" in the last 168 hours. No results for input(s): "AMMONIA" in the last 168 hours. CBC: Recent Labs  Lab 08/01/22 2236 08/02/22 0427 08/02/22 1635 08/03/22 0709 08/04/22 0419 08/05/22 0704  WBC 17.3* 16.7* 15.7* 12.2* 13.8* 11.7*  NEUTROABS 11.3* 10.6*  --   --   --   --   HGB 16.7 15.5 15.2 15.4 15.3 14.7  HCT 48.3 45.2 45.3 46.3 45.6 44.6  MCV 89.1 89.9 90.4 90.3 91.8 91.0  PLT 208 190 176 172 154 173   Cardiac Enzymes: No results for input(s): "CKTOTAL", "CKMB", "CKMBINDEX", "TROPONINI" in the last 168 hours. BNP: Invalid input(s): "POCBNP" CBG: No results for input(s): "GLUCAP" in the last 168  hours. D-Dimer No results for input(s): "DDIMER" in the last 72 hours.  Hgb A1c No results for input(s): "HGBA1C" in the last 72 hours. Lipid Profile No results for input(s): "CHOL", "HDL", "LDLCALC", "TRIG", "CHOLHDL", "LDLDIRECT" in the last 72 hours. Thyroid function studies No results for input(s): "TSH", "T4TOTAL", "T3FREE", "THYROIDAB" in the last 72 hours.  Invalid input(s): "FREET3" Anemia work up No results for input(s): "VITAMINB12", "FOLATE", "FERRITIN", "TIBC", "IRON", "RETICCTPCT" in the last 72 hours. Urinalysis    Component Value Date/Time   BILIRUBINUR neg 06/08/2022 1141   PROTEINUR Negative 06/08/2022 1141   UROBILINOGEN 0.2 06/08/2022 1141   NITRITE neg 06/08/2022 1141   LEUKOCYTESUR Negative 06/08/2022 1141   Sepsis Labs Recent Labs  Lab 08/02/22 1635 08/03/22 0709 08/04/22 0419 08/05/22 0704  WBC 15.7* 12.2* 13.8* 11.7*   Microbiology No results found for this or any previous visit (from the past 240 hour(s)).   Time coordinating discharge: Over 30 minutes  SIGNED:   Shawna Clamp, MD  [facility] 08/05/2022, 3:47 PM Pager   If 7PM-7AM, please contact night-coverage

## 2022-08-05 ENCOUNTER — Encounter: Payer: Self-pay | Admitting: Internal Medicine

## 2022-08-05 DIAGNOSIS — T148XXA Other injury of unspecified body region, initial encounter: Secondary | ICD-10-CM | POA: Diagnosis not present

## 2022-08-05 LAB — CBC
HCT: 44.6 % (ref 39.0–52.0)
Hemoglobin: 14.7 g/dL (ref 13.0–17.0)
MCH: 30 pg (ref 26.0–34.0)
MCHC: 33 g/dL (ref 30.0–36.0)
MCV: 91 fL (ref 80.0–100.0)
Platelets: 173 10*3/uL (ref 150–400)
RBC: 4.9 MIL/uL (ref 4.22–5.81)
RDW: 13.7 % (ref 11.5–15.5)
WBC: 11.7 10*3/uL — ABNORMAL HIGH (ref 4.0–10.5)
nRBC: 0 % (ref 0.0–0.2)

## 2022-08-05 LAB — HEPATIC FUNCTION PANEL
ALT: 69 U/L — ABNORMAL HIGH (ref 0–44)
AST: 42 U/L — ABNORMAL HIGH (ref 15–41)
Albumin: 3.7 g/dL (ref 3.5–5.0)
Alkaline Phosphatase: 89 U/L (ref 38–126)
Bilirubin, Direct: 0.2 mg/dL (ref 0.0–0.2)
Indirect Bilirubin: 0.6 mg/dL (ref 0.3–0.9)
Total Bilirubin: 0.8 mg/dL (ref 0.3–1.2)
Total Protein: 6.8 g/dL (ref 6.5–8.1)

## 2022-08-05 MED ORDER — OXYCODONE HCL 5 MG PO TABS: 5.0000 mg | ORAL_TABLET | Freq: Three times a day (TID) | ORAL | 0 refills | Status: DC | PRN

## 2022-08-05 NOTE — Progress Notes (Signed)
Discharge instructions were reviewed with pt. Questions were encourage and answered. IV were taken out. Pt was given pain medications before discharge for a 5/10 right ankle pain.

## 2022-08-05 NOTE — Progress Notes (Signed)
Mobility Specialist - Progress Note   08/05/22 1110  Mobility  Activity Ambulated independently in hallway  Level of Assistance Modified independent, requires aide device or extra time  Assistive Device Front wheel walker  Distance Ambulated (ft) 160 ft  Activity Response Tolerated well  $Mobility charge 1 Mobility   Pt supine upon entry, utilizing RA. Pt completed bed mob indep, STS to RW  mod indep. Pt ambulated one lap around the NS using RW, touch down weight-bearing on the right foot to maintain balance. Pt left supine with foot elevated and needs within reach. No complaints.   Candie Mile Mobility Specialist 08/05/22 11:18 AM

## 2022-08-05 NOTE — Plan of Care (Signed)

## 2022-08-08 ENCOUNTER — Telehealth: Payer: Self-pay | Admitting: Family Medicine

## 2022-08-08 ENCOUNTER — Encounter: Payer: Self-pay | Admitting: Family Medicine

## 2022-08-08 ENCOUNTER — Ambulatory Visit (INDEPENDENT_AMBULATORY_CARE_PROVIDER_SITE_OTHER): Payer: Managed Care, Other (non HMO) | Admitting: Family Medicine

## 2022-08-08 VITALS — BP 142/86 | HR 101 | Temp 98.3°F | Ht 68.75 in | Wt 211.4 lb

## 2022-08-08 DIAGNOSIS — T148XXA Other injury of unspecified body region, initial encounter: Secondary | ICD-10-CM | POA: Diagnosis not present

## 2022-08-08 DIAGNOSIS — F1010 Alcohol abuse, uncomplicated: Secondary | ICD-10-CM

## 2022-08-08 MED ORDER — HYDROCODONE-ACETAMINOPHEN 5-325 MG PO TABS: 1.0000 | ORAL_TABLET | Freq: Four times a day (QID) | ORAL | 0 refills | Status: DC | PRN

## 2022-08-08 NOTE — Telephone Encounter (Signed)
Pt called stating his insurance company was going to fax over some fmla forms for tower to sign. Pt stated he needed papers back asap. Call back # [phone].
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
## 2022-08-08 NOTE — Assessment & Plan Note (Signed)
No etoh for 1 wk after set back while traveling No w/d symptoms- pt is doing well

## 2022-08-08 NOTE — Patient Instructions (Signed)
Keep foot elevated  Take the norco for pain   In 3 more days you can try adding ibuprofen (take with food) Let me know if you need a px for 800  Follow up in a week   Call on Thursday to update [place]  Let us know if you need a refill of noco and ibuprofen    Stay out of work for now

## 2022-08-08 NOTE — Assessment & Plan Note (Signed)
Suspected copper head snake bite of R lateral foot  Post hosp f/u  Reviewed hospital records, lab results and studies in detail  Reassuring w/u/imaging and labs  Was able to tolerate cro fab treatment  Now home/elevating it  Still mod to severe pain and swelling norco px for 5/325 mg q 6 h prn (5 d) -will likely need refill mid week  Of note-no alcohol currently and will watch his LFTs  Can start some ibuprofen later in week if tolerated (coag tests were nl)  Adv to elevate  Warm/cool compress prn  Wt bearing at tolerated F/u in 1 wk  inst to update by phone on Thursday for refill of norco and ibup  ER precautions rev in detail  Will watch for worse pain/swelling or redness  Tramadol may be opt in the future Oxycodone did not help for pain control per pt

## 2022-08-09 ENCOUNTER — Telehealth: Payer: Self-pay

## 2022-08-09 ENCOUNTER — Encounter: Payer: Self-pay | Admitting: Family Medicine

## 2022-08-09 ENCOUNTER — Telehealth: Payer: Self-pay | Admitting: Family Medicine

## 2022-08-09 NOTE — Telephone Encounter (Signed)
Done and in In box I extended his leave to 1 month to start. If he recovers more quickly then we can shorten it later.  I will see him on 10/9 as planned

## 2022-08-09 NOTE — Telephone Encounter (Signed)
Have him call Thursday or Friday for his hydrocodone refill-it should not be a problem. I am aware that he was on oxycodone prior -that is fine. We can do his drug screen on 10/9 when I see him. I will work on paperwork when I am done with pt care (just rec it this am)  [facility] he feels better soon!

## 2022-08-09 NOTE — Telephone Encounter (Signed)
Pt notified of Dr. Marliss Coots comments and verbalized understanding. Pt will discuss switching med to tramadol if possible at [place] on 08/15/22, PCP aware

## 2022-08-09 NOTE — Telephone Encounter (Signed)
Done in IN box

## 2022-08-09 NOTE — Telephone Encounter (Signed)
Patient called in and stated that he will need a refill of his Hydrocodone soon and that he suppose to take a drug test prior to it being refilled. He was wanting to know if he could do the drug test on Monday (08/15/2022) if he call for a refill on Thursday or Friday. He stated that he took a Oxycodone that was prescribed  by his other doctor and wanted to know if that would interfere with the drug test. He will like a call back once the FMLA paperwork is complete and sent back in to his employer. Please advise. Thank you!

## 2022-08-09 NOTE — Telephone Encounter (Signed)
We received an FMLA form for patient.  This has been placed in your inbox for completion and signing.

## 2022-08-10 MED ORDER — IBUPROFEN 800 MG PO TABS: 800.0000 mg | ORAL_TABLET | Freq: Three times a day (TID) | ORAL | 0 refills | Status: DC | PRN

## 2022-08-10 NOTE — Telephone Encounter (Signed)
I left a vmail for patient to call me back at [phone].  There are three pages that he needs to sign in the FMLA forms for [place] to be able to send in his personal information.  I have the forms at my desk.  I also sent a mychart msg about this.

## 2022-08-10 NOTE — Telephone Encounter (Signed)
FMLA form has been faxed to [phone].  Received fax confirmation.    Also faxed treatment plan along with last office note and labs to [place] at [phone].  Received fax confirmation.  Patient came in to sign forms for FMLA form.  Copies of both faxed forms were given to patient while he was in office.  Copies have been made for scanning and myself.

## 2022-08-15 ENCOUNTER — Encounter: Payer: Self-pay | Admitting: Family Medicine

## 2022-08-15 ENCOUNTER — Ambulatory Visit (INDEPENDENT_AMBULATORY_CARE_PROVIDER_SITE_OTHER): Payer: Managed Care, Other (non HMO) | Admitting: Family Medicine

## 2022-08-15 VITALS — BP 130/80 | HR 98 | Temp 97.9°F | Ht 68.75 in | Wt 211.2 lb

## 2022-08-15 DIAGNOSIS — F1010 Alcohol abuse, uncomplicated: Secondary | ICD-10-CM

## 2022-08-15 DIAGNOSIS — T148XXA Other injury of unspecified body region, initial encounter: Secondary | ICD-10-CM | POA: Diagnosis not present

## 2022-08-15 LAB — CBC WITH DIFFERENTIAL/PLATELET
Basophils Absolute: 0.2 10*3/uL — ABNORMAL HIGH (ref 0.0–0.1)
Basophils Relative: 1.1 % (ref 0.0–3.0)
Eosinophils Absolute: 0.4 10*3/uL (ref 0.0–0.7)
Eosinophils Relative: 2.4 % (ref 0.0–5.0)
HCT: 45 % (ref 39.0–52.0)
Hemoglobin: 15.1 g/dL (ref 13.0–17.0)
Lymphocytes Relative: 18.1 % (ref 12.0–46.0)
Lymphs Abs: 3.1 10*3/uL (ref 0.7–4.0)
MCHC: 33.6 g/dL (ref 30.0–36.0)
MCV: 92.9 fl (ref 78.0–100.0)
Monocytes Absolute: 1 10*3/uL (ref 0.1–1.0)
Monocytes Relative: 5.9 % (ref 3.0–12.0)
Neutro Abs: 12.3 10*3/uL — ABNORMAL HIGH (ref 1.4–7.7)
Neutrophils Relative %: 72.5 % (ref 43.0–77.0)
Platelets: 407 10*3/uL — ABNORMAL HIGH (ref 150.0–400.0)
RBC: 4.85 Mil/uL (ref 4.22–5.81)
RDW: 14.9 % (ref 11.5–15.5)
WBC: 17 10*3/uL — ABNORMAL HIGH (ref 4.0–10.5)

## 2022-08-15 LAB — BASIC METABOLIC PANEL
BUN: 15 mg/dL (ref 6–23)
CO2: 26 mEq/L (ref 19–32)
Calcium: 10.4 mg/dL (ref 8.4–10.5)
Chloride: 100 mEq/L (ref 96–112)
Creatinine, Ser: 0.81 mg/dL (ref 0.40–1.50)
GFR: 111.98 mL/min (ref >60.00)
Glucose, Bld: 92 mg/dL (ref 70–99)
Potassium: 4.7 mEq/L (ref 3.5–5.1)
Sodium: 136 mEq/L (ref 135–145)

## 2022-08-15 LAB — HEPATIC FUNCTION PANEL
ALT: 43 U/L (ref 0–53)
AST: 28 U/L (ref 0–37)
Albumin: 4.7 g/dL (ref 3.5–5.2)
Alkaline Phosphatase: 109 U/L (ref 39–117)
Bilirubin, Direct: 0.1 mg/dL (ref 0.0–0.3)
Total Bilirubin: 0.5 mg/dL (ref 0.2–1.2)
Total Protein: 7.4 g/dL (ref 6.0–8.3)

## 2022-08-15 NOTE — Patient Instructions (Addendum)
Keep elevating your foot whenever you can   Your foot looks better  If pain or redness or swelling worsen again let us know   Continue ibuprofen  (take it with food)  Drink lots of fluids while you are on this   Lab today   Follow up in another week

## 2022-08-15 NOTE — Assessment & Plan Note (Signed)
Pt notes he has struggled more being at home recovering from injury  6 beers yesterday (otherwise none) and feels better about it today   Lab today for LFT
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
## 2022-08-15 NOTE — Assessment & Plan Note (Signed)
Snake bite R foot starting to improve  Pain control with ibuprofen alone now  Elevates  Limited weight bearing but doing some better  Will continue to observe  1 mo time off with fmla , can return earlier if needed  Continue elevation  Can soak prn since no open skin as well  F/u 1 wk  Inst to call if worse red/swell/pain or any drainage

## 2022-08-24 ENCOUNTER — Encounter: Payer: Self-pay | Admitting: Family Medicine

## 2022-08-24 ENCOUNTER — Ambulatory Visit (INDEPENDENT_AMBULATORY_CARE_PROVIDER_SITE_OTHER): Payer: Managed Care, Other (non HMO) | Admitting: Family Medicine

## 2022-08-24 VITALS — BP 124/72 | HR 91 | Temp 98.0°F | Ht 68.75 in | Wt 211.0 lb

## 2022-08-24 DIAGNOSIS — T148XXA Other injury of unspecified body region, initial encounter: Secondary | ICD-10-CM | POA: Diagnosis not present

## 2022-08-24 DIAGNOSIS — M7989 Other specified soft tissue disorders: Secondary | ICD-10-CM

## 2022-08-24 DIAGNOSIS — F1010 Alcohol abuse, uncomplicated: Secondary | ICD-10-CM | POA: Diagnosis not present

## 2022-08-24 NOTE — Assessment & Plan Note (Signed)
More swelling and discomfort in R calf in setting of healing snake bite on foot  Venous US ordered to r/o dvt or vascular change  Pt voiced understanding  ER precautions noted Will continue to elevate
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
## 2022-08-24 NOTE — Patient Instructions (Addendum)
Have your work place send another set of paperwork on the off chance we need it   Follow up in about a week   I will set up an ultrasound of your leg to make sure there is no clot   Take care of yourself   Venous ultrasound: Tomorrow at 2pm 08/25/2022 at [facility] by 1:30, Entrance C (Heart and Vascular Entrance on [address] off [address] and [address])

## 2022-08-24 NOTE — Assessment & Plan Note (Signed)
Continues to drink less  lfts are normalized Enc him to keep working on it  [employer]

## 2022-08-24 NOTE — Assessment & Plan Note (Signed)
R foot continues to improve- slowly improving swelling/redness Some peeling skin today  Able to stand for about 30 minutes  Some R calf swelling- venous US ordered  F/u planned in a week  Will extend his fmla if needed (original return to work on 09/01/22)   - asked for another set to fill out next week if not ready to return  Is making good progress

## 2022-08-25 ENCOUNTER — Ambulatory Visit (HOSPITAL_COMMUNITY)
Admission: RE | Admit: 2022-08-25 | Discharge: 2022-08-25 | Disposition: A | Discharge status: Home / Self Care | Payer: Managed Care, Other (non HMO) | Source: Ambulatory Visit | Attending: Family Medicine | Admitting: Family Medicine

## 2022-08-25 DIAGNOSIS — M7989 Other specified soft tissue disorders: Secondary | ICD-10-CM | POA: Insufficient documentation

## 2022-08-25 NOTE — Progress Notes (Signed)
Lower extremity venous has been completed.   Preliminary results in CV Proc.   Lance Foley 08/25/2022 2:44 PM

## 2022-08-31 ENCOUNTER — Telehealth: Payer: Self-pay

## 2022-08-31 ENCOUNTER — Encounter: Payer: Self-pay | Admitting: *Deleted

## 2022-08-31 ENCOUNTER — Encounter: Payer: Self-pay | Admitting: Family Medicine

## 2022-08-31 ENCOUNTER — Ambulatory Visit (INDEPENDENT_AMBULATORY_CARE_PROVIDER_SITE_OTHER): Payer: Managed Care, Other (non HMO) | Admitting: Family Medicine

## 2022-08-31 VITALS — BP 132/78 | HR 102 | Temp 97.6°F | Ht 68.75 in | Wt 210.1 lb

## 2022-08-31 DIAGNOSIS — T63001D Toxic effect of unspecified snake venom, accidental (unintentional), subsequent encounter: Secondary | ICD-10-CM | POA: Diagnosis not present

## 2022-08-31 DIAGNOSIS — D72825 Bandemia: Secondary | ICD-10-CM

## 2022-08-31 DIAGNOSIS — L539 Erythematous condition, unspecified: Secondary | ICD-10-CM | POA: Diagnosis not present

## 2022-08-31 DIAGNOSIS — F1010 Alcohol abuse, uncomplicated: Secondary | ICD-10-CM

## 2022-08-31 DIAGNOSIS — R609 Edema, unspecified: Secondary | ICD-10-CM

## 2022-08-31 DIAGNOSIS — T148XXA Other injury of unspecified body region, initial encounter: Secondary | ICD-10-CM

## 2022-08-31 DIAGNOSIS — R7401 Elevation of levels of liver transaminase levels: Secondary | ICD-10-CM

## 2022-08-31 DIAGNOSIS — M7989 Other specified soft tissue disorders: Secondary | ICD-10-CM

## 2022-08-31 LAB — CBC WITH DIFFERENTIAL/PLATELET
Basophils Absolute: 0.1 10*3/uL (ref 0.0–0.1)
Basophils Relative: 0.5 % (ref 0.0–3.0)
Eosinophils Absolute: 0.6 10*3/uL (ref 0.0–0.7)
Eosinophils Relative: 3.1 % (ref 0.0–5.0)
HCT: 48.6 % (ref 39.0–52.0)
Hemoglobin: 16 g/dL (ref 13.0–17.0)
Lymphocytes Relative: 18.2 % (ref 12.0–46.0)
Lymphs Abs: 3.6 10*3/uL (ref 0.7–4.0)
MCHC: 32.9 g/dL (ref 30.0–36.0)
MCV: 92.4 fl (ref 78.0–100.0)
Monocytes Absolute: 1.3 10*3/uL — ABNORMAL HIGH (ref 0.1–1.0)
Monocytes Relative: 6.6 % (ref 3.0–12.0)
Neutro Abs: 14.3 10*3/uL — ABNORMAL HIGH (ref 1.4–7.7)
Neutrophils Relative %: 71.6 % (ref 43.0–77.0)
Platelets: 349 10*3/uL (ref 150.0–400.0)
RBC: 5.26 Mil/uL (ref 4.22–5.81)
RDW: 14.1 % (ref 11.5–15.5)
WBC: 20 10*3/uL (ref 4.0–10.5)

## 2022-08-31 LAB — HEPATIC FUNCTION PANEL
ALT: 38 U/L (ref 0–53)
AST: 22 U/L (ref 0–37)
Albumin: 4.9 g/dL (ref 3.5–5.2)
Alkaline Phosphatase: 108 U/L (ref 39–117)
Bilirubin, Direct: 0.1 mg/dL (ref 0.0–0.3)
Total Bilirubin: 0.4 mg/dL (ref 0.2–1.2)
Total Protein: 7.8 g/dL (ref 6.0–8.3)

## 2022-08-31 NOTE — Assessment & Plan Note (Signed)
Re check cbc today  Thought to be due to smoking in the past (saw hematology in 2015) No fever or other symptoms  Snake bite site continues to improve

## 2022-08-31 NOTE — Assessment & Plan Note (Signed)
Due to snake bite Is gradually improving  LE doppler was reassuring-neg for DVT

## 2022-08-31 NOTE — Assessment & Plan Note (Signed)
Pt is interested in help/counseling to quit  Drank a case of beer over the past week  Given # for [facility] to call
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
## 2022-08-31 NOTE — Telephone Encounter (Signed)
CRITICAL VALUE STICKER  CRITICAL VALUE: WBC 20.0  RECEIVER (on-site recipient of call): Masiah Woody   DATE & TIME NOTIFIED:  08/31/2022  MESSENGER (representative from lab): Shari Prows   MD NOTIFIED: Glori Bickers / [place]   TIME OF NOTIFICATION: 3:14 pm   RESPONSE:      Latest Ref Rng & Units 08/31/2022    9:30 AM 08/15/2022   11:01 AM 08/05/2022    7:04 AM  CBC  WBC 4.0 - 10.5 K/uL 20.0 Repeated and verified X2.  17.0  11.7   Hemoglobin 13.0 - 17.0 g/dL 16.0  15.1  14.7   Hematocrit 39.0 - 52.0 % 48.6  45.0  44.6   Platelets 150.0 - 400.0 K/uL 349.0  407.0  173

## 2022-08-31 NOTE — Assessment & Plan Note (Signed)
Lab today  Drank one case of beer total last week  Lab today  Interested in abd [place] to image liver as well- this was ordered Denies abd pain

## 2022-08-31 NOTE — Patient Instructions (Addendum)
The foot is looking better  Increase your activity /standing as tolerated this week   When you sit , try and elevate it   Goal is to return to work on November 2   Labs today   [facility] to inquire about alcohol counseling   [place] [address] [place], [place] [address]  [phone]  If they require a referral then let me know   Lets check an ultrasound of liver  If you don't get a call in 1-2 weeks let us know

## 2022-08-31 NOTE — Assessment & Plan Note (Signed)
This continues to improve Can stand up to an hour without a lot of discomfort but does not think he can make it through a standing work day  Note written to return 11/2 if able  Will continue elevation and adv activity gradually as tolerated  inst to call if worse /inc redness or swelling Doppler of R leg was nl for DVT as well

## 2022-09-01 ENCOUNTER — Telehealth: Payer: Self-pay

## 2022-09-01 NOTE — Telephone Encounter (Signed)
We received an additional FMLA form for patient.  Form has been placed in your inbox for completion and signing.

## 2022-09-01 NOTE — Telephone Encounter (Signed)
I am out till Monday but will do first thing then  Thanks

## 2022-09-05 NOTE — Telephone Encounter (Signed)
Form faxed to fax#[phone].  Received fax confirmation.  Copies have been made for scanning, patient, and myself.  Sent a mychart msg to see if patient wants his copy mailed or kept at the front office for pickup.  I have patient's copy at my desk until he responds.

## 2022-09-06 NOTE — Telephone Encounter (Signed)
Patient's copy placed in outgoing mail.  Sent msg via [employer].

## 2022-09-07 ENCOUNTER — Other Ambulatory Visit: Payer: Self-pay | Admitting: Family Medicine

## 2022-09-07 NOTE — Telephone Encounter (Signed)
Lance Foley,  Patient was bitten by cooper head snake. Patient states he will call us when he is ready for treatment. Awaiting call back from patient.

## 2022-09-07 NOTE — Telephone Encounter (Signed)
Last filled on 08/10/22 #90 tab/ 0 refills, CPE scheduled on 12/05/22

## 2022-09-19 ENCOUNTER — Telehealth: Payer: Self-pay | Admitting: Family Medicine

## 2022-09-19 DIAGNOSIS — R7401 Elevation of levels of liver transaminase levels: Secondary | ICD-10-CM

## 2022-09-19 DIAGNOSIS — D72825 Bandemia: Secondary | ICD-10-CM

## 2022-09-19 NOTE — Telephone Encounter (Signed)
-----   Message from Ellamae Sia sent at 09/07/2022 11:44 AM EDT ----- Regarding: Lab orders for Tuesday, 11.14.23 Lab orders for 2 week f/u

## 2022-09-20 ENCOUNTER — Other Ambulatory Visit: Payer: Managed Care, Other (non HMO)

## 2022-09-20 DIAGNOSIS — R7401 Elevation of levels of liver transaminase levels: Secondary | ICD-10-CM | POA: Diagnosis not present

## 2022-09-20 DIAGNOSIS — D72825 Bandemia: Secondary | ICD-10-CM | POA: Diagnosis not present

## 2022-09-20 LAB — CBC WITH DIFFERENTIAL/PLATELET
Basophils Absolute: 0.1 10*3/uL (ref 0.0–0.1)
Basophils Relative: 0.7 % (ref 0.0–3.0)
Eosinophils Absolute: 0.2 10*3/uL (ref 0.0–0.7)
Eosinophils Relative: 1.6 % (ref 0.0–5.0)
HCT: 49.8 % (ref 39.0–52.0)
Hemoglobin: 16.6 g/dL (ref 13.0–17.0)
Lymphocytes Relative: 24.4 % (ref 12.0–46.0)
Lymphs Abs: 3.1 10*3/uL (ref 0.7–4.0)
MCHC: 33.4 g/dL (ref 30.0–36.0)
MCV: 94.2 fl (ref 78.0–100.0)
Monocytes Absolute: 0.8 10*3/uL (ref 0.1–1.0)
Monocytes Relative: 6.7 % (ref 3.0–12.0)
Neutro Abs: 8.4 10*3/uL — ABNORMAL HIGH (ref 1.4–7.7)
Neutrophils Relative %: 66.6 % (ref 43.0–77.0)
Platelets: 270 10*3/uL (ref 150.0–400.0)
RBC: 5.28 Mil/uL (ref 4.22–5.81)
RDW: 15.1 % (ref 11.5–15.5)
WBC: 12.7 10*3/uL — ABNORMAL HIGH (ref 4.0–10.5)

## 2022-09-20 LAB — HEPATIC FUNCTION PANEL
ALT: 30 U/L (ref 0–53)
AST: 20 U/L (ref 0–37)
Albumin: 4.8 g/dL (ref 3.5–5.2)
Alkaline Phosphatase: 88 U/L (ref 39–117)
Bilirubin, Direct: 0.1 mg/dL (ref 0.0–0.3)
Total Bilirubin: 0.4 mg/dL (ref 0.2–1.2)
Total Protein: 7.4 g/dL (ref 6.0–8.3)

## 2022-09-26 ENCOUNTER — Other Ambulatory Visit: Payer: Managed Care, Other (non HMO)

## 2022-10-03 ENCOUNTER — Encounter: Payer: Managed Care, Other (non HMO) | Admitting: Family Medicine

## 2022-10-05 ENCOUNTER — Ambulatory Visit (HOSPITAL_COMMUNITY)
Admission: RE | Admit: 2022-10-05 | Discharge: 2022-10-05 | Disposition: A | Discharge status: Home / Self Care | Payer: Managed Care, Other (non HMO) | Source: Ambulatory Visit | Attending: Family Medicine | Admitting: Family Medicine

## 2022-10-05 DIAGNOSIS — R7401 Elevation of levels of liver transaminase levels: Secondary | ICD-10-CM | POA: Diagnosis present

## 2022-11-16 ENCOUNTER — Ambulatory Visit: Payer: Managed Care, Other (non HMO) | Admitting: Family Medicine

## 2022-11-22 ENCOUNTER — Ambulatory Visit: Payer: Managed Care, Other (non HMO)

## 2022-11-23 ENCOUNTER — Ambulatory Visit (INDEPENDENT_AMBULATORY_CARE_PROVIDER_SITE_OTHER): Payer: Managed Care, Other (non HMO)

## 2022-11-23 VITALS — BP 159/104 | HR 95 | Temp 97.9°F | Resp 16 | Ht 69.0 in | Wt 216.2 lb

## 2022-11-23 DIAGNOSIS — E7801 Familial hypercholesterolemia: Secondary | ICD-10-CM

## 2022-11-23 MED ORDER — INCLISIRAN SODIUM 284 MG/1.5ML ~~LOC~~ SOSY
284.0000 mg | PREFILLED_SYRINGE | Freq: Once | SUBCUTANEOUS | Status: AC
  Administered 2022-11-23: 284 mg via SUBCUTANEOUS
  Filled 2022-11-23: qty 1.5

## 2022-11-23 NOTE — Progress Notes (Signed)
Diagnosis: Hyperlipidemia  Provider:  Marshell Garfinkel MD  Procedure: Injection  Leqvio (inclisiran), Dose: 284 mg, Site: subcutaneous, Number of injections: 1  Post Care: Observation period completed  Discharge: Condition: Good, Destination: Home . AVS provided to patient.   Performed by:  Koren Shiver, RN

## 2022-11-23 NOTE — Patient Instructions (Signed)
Inclisiran Injection What is this medication? INCLISIRAN (in [place] an) treats high cholesterol. It works by decreasing bad cholesterol (such as LDL) in your blood. Changes to diet and exercise are often combined with this medication. This medicine may be used for other purposes; ask your health care provider or pharmacist if you have questions. COMMON BRAND NAME(S): LEQVIO What should I tell my care team before I take this medication? They need to know if you have any of these conditions: An unusual or allergic reaction to inclisiran, other medications, foods, dyes, or preservatives Pregnant or trying to get pregnant Breast-feeding How should I use this medication? This medication is injected under the skin. It is given by your care team in a hospital or clinic setting. Talk to your care team about the use of this medication in children. Special care may be needed. Overdosage: If you think you have taken too much of this medicine contact a poison control center or emergency room at once. NOTE: This medicine is only for you. Do not share this medicine with others. What if I miss a dose? Keep appointments for follow-up doses. It is important not to miss your dose. Call your care team if you are unable to keep an appointment. What may interact with this medication? Interactions are not expected. This list may not describe all possible interactions. Give your health care provider a list of all the medicines, herbs, non-prescription drugs, or dietary supplements you use. Also tell them if you smoke, drink alcohol, or use illegal drugs. Some items may interact with your medicine. What should I watch for while using this medication? Visit your care team for regular checks on your progress. Tell your care team if your symptoms do not start to get better or if they get worse. You may need blood work while you are taking this medication. What side effects may I notice from receiving this  medication? Side effects that you should report to your care team as soon as possible: Allergic reactions--skin rash, itching, hives, swelling of the face, lips, tongue, or throat Side effects that usually do not require medical attention (report these to your care team if they continue or are bothersome): Joint pain Pain, redness, or irritation at injection site This list may not describe all possible side effects. Call your doctor for medical advice about side effects. You may report side effects to FDA at 1-800-FDA-1088. Where should I keep my medication? This medication is given in a hospital or clinic. It will not be stored at home. NOTE: This sheet is a summary. It may not cover all possible information. If you have questions about this medicine, talk to your doctor, pharmacist, or health care provider.  2023 Elsevier/Gold Standard (2020-11-11 00:00:00)

## 2022-11-27 ENCOUNTER — Telehealth: Payer: Self-pay | Admitting: Family Medicine

## 2022-11-27 DIAGNOSIS — E7800 Pure hypercholesterolemia, unspecified: Secondary | ICD-10-CM

## 2022-11-27 DIAGNOSIS — Z0000 Encounter for general adult medical examination without abnormal findings: Secondary | ICD-10-CM | POA: Insufficient documentation

## 2022-11-27 DIAGNOSIS — D72825 Bandemia: Secondary | ICD-10-CM

## 2022-11-27 DIAGNOSIS — R7303 Prediabetes: Secondary | ICD-10-CM

## 2022-11-27 NOTE — Telephone Encounter (Signed)
-----  Message from Velna Hatchet, RT sent at 11/15/2022 10:20 AM EST ----- Regarding: Mon 1/22 lab Patient is scheduled for cpx, please order future labs.  Thanks, Anda Kraft

## 2022-11-28 ENCOUNTER — Telehealth: Payer: Self-pay | Admitting: Internal Medicine

## 2022-11-28 ENCOUNTER — Other Ambulatory Visit (INDEPENDENT_AMBULATORY_CARE_PROVIDER_SITE_OTHER): Payer: Managed Care, Other (non HMO)

## 2022-11-28 ENCOUNTER — Telehealth (INDEPENDENT_AMBULATORY_CARE_PROVIDER_SITE_OTHER): Payer: Managed Care, Other (non HMO) | Admitting: Family Medicine

## 2022-11-28 DIAGNOSIS — R5382 Chronic fatigue, unspecified: Secondary | ICD-10-CM | POA: Diagnosis not present

## 2022-11-28 DIAGNOSIS — E7800 Pure hypercholesterolemia, unspecified: Secondary | ICD-10-CM

## 2022-11-28 DIAGNOSIS — D72825 Bandemia: Secondary | ICD-10-CM | POA: Diagnosis not present

## 2022-11-28 DIAGNOSIS — T466X5A Adverse effect of antihyperlipidemic and antiarteriosclerotic drugs, initial encounter: Secondary | ICD-10-CM

## 2022-11-28 DIAGNOSIS — Z0000 Encounter for general adult medical examination without abnormal findings: Secondary | ICD-10-CM

## 2022-11-28 DIAGNOSIS — E349 Endocrine disorder, unspecified: Secondary | ICD-10-CM

## 2022-11-28 DIAGNOSIS — R7303 Prediabetes: Secondary | ICD-10-CM | POA: Diagnosis not present

## 2022-11-28 DIAGNOSIS — E7801 Familial hypercholesterolemia: Secondary | ICD-10-CM

## 2022-11-28 LAB — CBC WITH DIFFERENTIAL/PLATELET
Basophils Absolute: 0.1 10*3/uL (ref 0.0–0.1)
Basophils Relative: 0.7 % (ref 0.0–3.0)
Eosinophils Absolute: 0.3 10*3/uL (ref 0.0–0.7)
Eosinophils Relative: 2.3 % (ref 0.0–5.0)
HCT: 52.5 % — ABNORMAL HIGH (ref 39.0–52.0)
Hemoglobin: 17.6 g/dL — ABNORMAL HIGH (ref 13.0–17.0)
Lymphocytes Relative: 18.8 % (ref 12.0–46.0)
Lymphs Abs: 2.6 10*3/uL (ref 0.7–4.0)
MCHC: 33.6 g/dL (ref 30.0–36.0)
MCV: 94.2 fl (ref 78.0–100.0)
Monocytes Absolute: 0.9 10*3/uL (ref 0.1–1.0)
Monocytes Relative: 6.7 % (ref 3.0–12.0)
Neutro Abs: 9.7 10*3/uL — ABNORMAL HIGH (ref 1.4–7.7)
Neutrophils Relative %: 71.5 % (ref 43.0–77.0)
Platelets: 234 10*3/uL (ref 150.0–400.0)
RBC: 5.58 Mil/uL (ref 4.22–5.81)
RDW: 14.4 % (ref 11.5–15.5)
WBC: 13.6 10*3/uL — ABNORMAL HIGH (ref 4.0–10.5)

## 2022-11-28 LAB — COMPREHENSIVE METABOLIC PANEL
ALT: 71 U/L — ABNORMAL HIGH (ref 0–53)
AST: 42 U/L — ABNORMAL HIGH (ref 0–37)
Albumin: 4.7 g/dL (ref 3.5–5.2)
Alkaline Phosphatase: 78 U/L (ref 39–117)
BUN: 16 mg/dL (ref 6–23)
CO2: 30 mEq/L (ref 19–32)
Calcium: 10 mg/dL (ref 8.4–10.5)
Chloride: 99 mEq/L (ref 96–112)
Creatinine, Ser: 0.76 mg/dL (ref 0.40–1.50)
GFR: 113.93 mL/min (ref >60.00)
Glucose, Bld: 101 mg/dL — ABNORMAL HIGH (ref 70–99)
Potassium: 4 mEq/L (ref 3.5–5.1)
Sodium: 139 mEq/L (ref 135–145)
Total Bilirubin: 0.4 mg/dL (ref 0.2–1.2)
Total Protein: 6.8 g/dL (ref 6.0–8.3)

## 2022-11-28 LAB — LIPID PANEL
Cholesterol: 296 mg/dL — ABNORMAL HIGH (ref 0–200)
HDL: 57.3 mg/dL (ref >39.00)
LDL Cholesterol: 216 mg/dL — ABNORMAL HIGH (ref 0–99)
NonHDL: 238.7
Total CHOL/HDL Ratio: 5
Triglycerides: 116 mg/dL (ref 0.0–149.0)
VLDL: 23.2 mg/dL (ref 0.0–40.0)

## 2022-11-28 LAB — TESTOSTERONE: Testosterone: 350.79 ng/dL (ref 300.00–890.00)

## 2022-11-28 LAB — VITAMIN B12: Vitamin B-12: 718 pg/mL (ref 211–911)

## 2022-11-28 LAB — HEMOGLOBIN A1C: Hgb A1c MFr Bld: 6 % (ref 4.6–6.5)

## 2022-11-28 LAB — TSH: TSH: 2.11 u[IU]/mL (ref 0.35–5.50)

## 2022-11-28 NOTE — Telephone Encounter (Signed)
Pt called in today confused about when he should be f/u following his cholesterol shot 11/23/22. He has an appt in feburary and he wasn't sure if Dr. Debara Pickett told him to f/u then or in 6 mon after his shot. Please advise.

## 2022-11-28 NOTE — Telephone Encounter (Signed)
Added

## 2022-11-28 NOTE — Telephone Encounter (Signed)
-----  Message from Wallace Cullens sent at 11/28/2022  2:23 PM EST ----- [place],  I received a call from Mr. Lance Foley, he had genetic testing done on 9/5. Pt states he was told by Dr. Debara Pickett or his nurse that this would be covered by his insurance and he is now getting a bill or EOB saying that he is responsible.Glenard Haring asked me to reach out to you to see if you know anything about this conversation.

## 2022-11-28 NOTE — Telephone Encounter (Signed)
Please see other telephone encounter.

## 2022-11-28 NOTE — Telephone Encounter (Signed)
Spoke with patient about genetic test. Advised that he would've been notified at his Sept 2023 appt that IF genetic test was not covered w/insurance, it would be $299 out of pocket that [employer] would bill. He could not recall this. He states he has not reiceved a paper bill but has gotten text messages that may be from this company. Provided him the phone # for [employer] to follow up on this.   Also, he had first Leqvio injection last week. His next is 02/22/23. Moved his Feb 2024 appt to May 2024 and mailed him lab work orders to be completed prior to visit.

## 2022-11-28 NOTE — Telephone Encounter (Signed)
-----  Message from Ellamae Sia sent at 11/28/2022  7:40 AM EST ----- Regarding: add a test Wants testosterone and vit b12 added

## 2022-12-05 ENCOUNTER — Ambulatory Visit (INDEPENDENT_AMBULATORY_CARE_PROVIDER_SITE_OTHER): Payer: Managed Care, Other (non HMO) | Admitting: Family Medicine

## 2022-12-05 ENCOUNTER — Encounter: Payer: Self-pay | Admitting: Family Medicine

## 2022-12-05 VITALS — BP 114/68 | HR 85 | Temp 98.2°F | Ht 68.75 in | Wt 218.1 lb

## 2022-12-05 DIAGNOSIS — F17200 Nicotine dependence, unspecified, uncomplicated: Secondary | ICD-10-CM | POA: Diagnosis not present

## 2022-12-05 DIAGNOSIS — F418 Other specified anxiety disorders: Secondary | ICD-10-CM | POA: Diagnosis not present

## 2022-12-05 DIAGNOSIS — R7401 Elevation of levels of liver transaminase levels: Secondary | ICD-10-CM

## 2022-12-05 DIAGNOSIS — Z0000 Encounter for general adult medical examination without abnormal findings: Secondary | ICD-10-CM

## 2022-12-05 DIAGNOSIS — H8109 Meniere's disease, unspecified ear: Secondary | ICD-10-CM | POA: Diagnosis not present

## 2022-12-05 DIAGNOSIS — E7800 Pure hypercholesterolemia, unspecified: Secondary | ICD-10-CM

## 2022-12-05 DIAGNOSIS — E349 Endocrine disorder, unspecified: Secondary | ICD-10-CM

## 2022-12-05 DIAGNOSIS — F1010 Alcohol abuse, uncomplicated: Secondary | ICD-10-CM

## 2022-12-05 NOTE — Assessment & Plan Note (Signed)
Pt has returned to drinking most days  Transaminases were normal when he quit drinking entirely  Now  Alt 71 and AST 42 [place] notes changes of fatty liver as expected Strongly enc pt to cut back etoh with eventual goal to quit  Also avoiding acetaminophen

## 2022-12-05 NOTE — Assessment & Plan Note (Signed)
Reviewed health habits including diet and exercise and skin cancer prevention Reviewed appropriate screening tests for age  Also reviewed health mt list, fam hx and immunization status , as well as social and family history   See HPI Labs reviewed  Declines flu shot Not ready to quit smoking Working on etoh intake  Disc mood and recommend counseling

## 2022-12-05 NOTE — Assessment & Plan Note (Signed)
Level is fairly stable at 350.7  Low normal range

## 2022-12-05 NOTE — Assessment & Plan Note (Signed)
Continues triamt-hct from ENT Clinically stable
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
## 2022-12-05 NOTE — Patient Instructions (Addendum)
Start with the [employer] list to find a counselor in network  If you ned up needing help let us know   Think about starting back walking   Continue multi vitamin daily  Try and eat a balanced diet   Try to get most of your carbohydrates from produce (with the exception of white potatoes)  Eat less bread/pasta/rice/snack foods/cereals/sweets and other items from the middle of the grocery store (processed carbs)   Add 500 mcg of vitamin B12 daily  See if this helps the feet tingling   Keep thinking about cutting alcohol and eventually quitting  Lets' re check liver tests in 2 months

## 2022-12-05 NOTE — Assessment & Plan Note (Signed)
Disc goals for lipids and reasons to control them Rev last labs with pt Rev low sat fat diet in detail Being tx with leqvio - last injection was not far enough before labs to make a big difference Continues cardiology f/u- appt with Dr Debara Pickett in May Shots are every 6 mo  Last LDL was 216 down from 235 Enc low sat/trans fat diet

## 2022-12-05 NOTE — Assessment & Plan Note (Signed)
Disc in detail risks of smoking and possible outcomes including copd, vascular/ heart disease, cancer , respiratory and sinus infections  Pt voices understanding Pt is not ready to quit at this time No symptoms beyond am cough  Baseline elevated Hb and wbc

## 2022-12-05 NOTE — Assessment & Plan Note (Signed)
Pt continues to drink some days  Better than a year ago  15-16 beers on those days  No w/d symptoms  Has mood issues Not ready to quit Dislikes 12 step Has been to rehab in the past  Rev abd [place], fatty liver as expected  LFTs are up from last time  Will re check in 2 mo

## 2022-12-05 NOTE — Assessment & Plan Note (Signed)
Pt is interested in counseling but having a hard time finding a good match Plans to call list that Lance Foley gave him of covered providers  Has lost faith in medication  No SI  Generally unhappy with where he is in life , needs motivation  Recent near panic attack  Still over using alcohol  Reviewed stressors/ coping techniques/symptoms/ support sources/ tx options and side effects in detail today  I think exercise would be a helpful tool for mood and self confidence  Offered ref to [facility] if his list does not work out

## 2022-12-13 ENCOUNTER — Ambulatory Visit: Payer: Managed Care, Other (non HMO) | Admitting: Internal Medicine

## 2022-12-15 ENCOUNTER — Ambulatory Visit: Payer: Managed Care, Other (non HMO) | Admitting: Internal Medicine

## 2023-01-24 ENCOUNTER — Telehealth: Payer: Self-pay | Admitting: Internal Medicine

## 2023-01-24 NOTE — Telephone Encounter (Signed)
Pt states he needs someone to file a claim with insurance for leqvio shots. Please advise

## 2023-01-24 NOTE — Telephone Encounter (Signed)
Spoke with patient and he states he received a bill for his leqvio injection and it was 3,000. He stated he call the manufacturer and was informed that provider needs to file a claim with them and they will cover the cost. Are you able to assist him with this

## 2023-01-25 NOTE — Telephone Encounter (Signed)
Patient wanted to know how much is the co-pay? When will it be applied?

## 2023-01-25 NOTE — Telephone Encounter (Signed)
I have informed Lance Foley to apply co-pay card to his claims/bill. Thanks Maudie Mercury

## 2023-01-25 NOTE — Telephone Encounter (Signed)
Left voicemail for patient to return call to office. 

## 2023-01-26 ENCOUNTER — Other Ambulatory Visit: Payer: Self-pay | Admitting: Pharmacy Technician

## 2023-01-26 NOTE — Telephone Encounter (Signed)
Left message for patient with info provided by [facility]

## 2023-01-26 NOTE — Telephone Encounter (Signed)
We are in the process of applying the copay dollars to his account. It takes the company some time (2-4 weeks) to process. Please let patient know they do not need to pay this bill and we will be putting a hold on it.    Thanks,  [employer]

## 2023-02-06 ENCOUNTER — Telehealth: Payer: Self-pay | Admitting: Pharmacy Technician

## 2023-02-06 NOTE — Telephone Encounter (Addendum)
Lance Foley Submission: APPROVED Site of care: Site of care: [facility] Payer: [employer] Medication & CPT/J Code(s) submitted: Leqvio (Inclisiran) [id number] Route of submission (phone, fax, portal):  Phone # [phone] Fax # [phone] Auth type: Pharmacy Benefit Units/visits requested: 3 Reference number:  Approval from: 02/07/23 to 02/07/24   Patient must use [facility]  health Pharmacy. Phone: Fax:   [employer] [facility] Vivi Martens [phone] ext: [phone]

## 2023-02-07 ENCOUNTER — Encounter: Payer: Self-pay | Admitting: Internal Medicine

## 2023-02-08 NOTE — Addendum Note (Signed)
Addended by: Fidel Levy on: 02/08/2023 01:28 PM   Modules accepted: Orders

## 2023-02-08 NOTE — Telephone Encounter (Signed)
Wynetta Fines, CPhT  Fidel Levy, RN My apologies about so many messages.  [employer] (Elana)  called me a 3rd time with updated pharmacy information for the script. Seems to be confusion on their end where we should send the script.   [facility]. Phone: [phone] Fax: [phone]    Spoke with [place] [phone] - Amy, [place] with [facility]  Send chart notes, med list

## 2023-02-09 NOTE — Telephone Encounter (Signed)
Jenna/Julie.  Patient has been approved for leqvio. Medication must be ordered through [facility]. We will need Dr. Debara Pickett signature on script/forms for the medication to be processed/delivered.  The forms has been scanned to Media tab [facility] Health/scrip) Please print forms and have Dr. Debara Pickett sign and fax back to the number at the top of the forms as soon as possible. Once medication is received, patient will be scheduled.  [place]: Phone: [phone] Fax: [phone]

## 2023-02-13 NOTE — Telephone Encounter (Signed)
[facility] is calling to speak with Eileen Stanford, Charity fundraiser.  Transferred.

## 2023-02-13 NOTE — Telephone Encounter (Signed)
Spoke with Amy, RPH with [facility]. Leqvio shipment to arrive on 4/16 at [facility].

## 2023-02-13 NOTE — Telephone Encounter (Signed)
Faxed to [facility] MD prescription order/office note

## 2023-02-14 NOTE — Telephone Encounter (Signed)
Thanks for the update

## 2023-02-22 ENCOUNTER — Ambulatory Visit (INDEPENDENT_AMBULATORY_CARE_PROVIDER_SITE_OTHER): Payer: Managed Care, Other (non HMO)

## 2023-02-22 VITALS — BP 162/96 | HR 86 | Temp 97.8°F | Resp 16 | Ht 69.0 in | Wt 213.6 lb

## 2023-02-22 DIAGNOSIS — E7801 Familial hypercholesterolemia: Secondary | ICD-10-CM

## 2023-02-22 MED ORDER — INCLISIRAN SODIUM 284 MG/1.5ML ~~LOC~~ SOSY
284.0000 mg | PREFILLED_SYRINGE | Freq: Once | SUBCUTANEOUS | Status: AC
  Administered 2023-02-22: 284 mg via SUBCUTANEOUS

## 2023-02-22 NOTE — Progress Notes (Signed)
Diagnosis: Hyperlipidemia  Provider:  Praveen Mannam MD  Procedure: Injection  Leqvio (inclisiran), Dose: 284 mg, Site: subcutaneous, Number of injections: 1  Post Care:  n/a  Discharge: Condition: Good, Destination: Home . AVS Declined  Performed by:  Ernie Kasler E Yash Cacciola, LPN       

## 2023-03-17 ENCOUNTER — Telehealth: Payer: Self-pay | Admitting: Internal Medicine

## 2023-03-17 NOTE — Telephone Encounter (Signed)
FYI  Patient states he does not want to take leqvio anymore stated he had back pain and it had him in a bad mood for three weeks and he feels its just not gone work for him.

## 2023-03-17 NOTE — Telephone Encounter (Signed)
Patient stated he canceled his [facility] appointment on 5/16 as he no longer wants to take the shots.

## 2023-03-23 ENCOUNTER — Ambulatory Visit: Payer: Managed Care, Other (non HMO) | Admitting: Internal Medicine

## 2023-03-28 NOTE — Telephone Encounter (Signed)
Allergy/intolerance list updated.

## 2023-06-01 DIAGNOSIS — H0288B Meibomian gland dysfunction left eye, upper and lower eyelids: Secondary | ICD-10-CM | POA: Diagnosis not present

## 2023-06-01 DIAGNOSIS — H02052 Trichiasis without entropian right lower eyelid: Secondary | ICD-10-CM | POA: Diagnosis not present

## 2023-08-22 ENCOUNTER — Encounter: Payer: Self-pay | Admitting: Internal Medicine

## 2023-08-23 ENCOUNTER — Encounter: Payer: Self-pay | Admitting: Family Medicine

## 2023-08-23 ENCOUNTER — Encounter: Payer: Self-pay | Admitting: Internal Medicine

## 2023-08-23 ENCOUNTER — Ambulatory Visit (INDEPENDENT_AMBULATORY_CARE_PROVIDER_SITE_OTHER): Payer: BC Managed Care – PPO | Admitting: Family Medicine

## 2023-08-23 VITALS — BP 136/88 | HR 95 | Temp 97.8°F | Ht 69.0 in | Wt 214.0 lb

## 2023-08-23 DIAGNOSIS — M545 Low back pain, unspecified: Secondary | ICD-10-CM | POA: Diagnosis not present

## 2023-08-23 NOTE — Patient Instructions (Addendum)
I think you have muscular lumbar strain / spasm   Use heat for 10 minutes whenever you can   Slow walking is good  Try the rehab stretches also   Update if not starting to improve in a week or if worsening   If you develop more pain radiating to the leg let us know  Or if any numbness /weakness   Ibuprofen is ok if it helps - take with food  The muscle relaxer (methocarbamol) may help a bit   Eventually working on abdominal strength will be important  You may need a new mattress also   If needed, we can set you up with physical therapy
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
## 2023-08-23 NOTE — Assessment & Plan Note (Signed)
History and exam consistent with lumbar strain and spasm, midline Loss of lordosis on exam and pain is worse with ext and lateral bend   Given handout on back pain / strain and rehab  Encouraged to start rehab stretches  Heat - 10 minutes when possible Walking  Watch for neuro signs and symptoms-discussed this  Ibuprofen 800 with food if needed Methocarbamol (from ortho prn)  If not improvement consider PT Imaging if worse or persistent  Update if not starting to improve in a week or if worsening  Call back and Er precautions noted in detail today

## 2023-08-28 DIAGNOSIS — F429 Obsessive-compulsive disorder, unspecified: Secondary | ICD-10-CM | POA: Diagnosis not present

## 2023-08-30 ENCOUNTER — Telehealth: Payer: Self-pay | Admitting: Internal Medicine

## 2023-08-30 NOTE — Telephone Encounter (Signed)
Pt c/o medication issue:  1. Name of Medication:   inclisiran (LEQVIO) injection 284 mg   2. How are you currently taking this medication (dosage and times per day)?  Caller stated patient has stopped taking this medication  3. Are you having a reaction (difficulty breathing--STAT)?   4. What is your medication issue?   Caller Marcelino Duster) stated patient does not want to be on this medication anymore.

## 2023-08-30 NOTE — Telephone Encounter (Signed)
Attempted to call patient to see why he wants to discontinue medication with no answer. Left message to return call.

## 2023-08-30 NOTE — Telephone Encounter (Signed)
Per 03/17/23 phone note, aware that patient was not taking Leqvio anymore d/t side effects

## 2023-10-10 ENCOUNTER — Telehealth (INDEPENDENT_AMBULATORY_CARE_PROVIDER_SITE_OTHER): Payer: Self-pay | Admitting: Otolaryngology

## 2023-10-10 NOTE — Telephone Encounter (Signed)
Patient called and requested Rx refills be sent in for his fluid pill, triamterene.  He was last seen by Dr. Suszanne Conners in Feb. 2023.  His preferred [facility] is [facility] @ [address] [place], [place].  He can be reached at [phone].

## 2023-10-11 ENCOUNTER — Telehealth (INDEPENDENT_AMBULATORY_CARE_PROVIDER_SITE_OTHER): Payer: Self-pay | Admitting: Otolaryngology

## 2023-10-11 NOTE — Telephone Encounter (Signed)
Left patient a message regarding  a refill on his Triamterene/ hydrochlorothiazide can not be refilled at this time. His  last office visit with Dr. Suszanne Conners was 12/2021. He can call his PCP or make a f/u appointment with Dr.Teoh.

## 2023-10-12 ENCOUNTER — Other Ambulatory Visit: Payer: Self-pay | Admitting: Family Medicine

## 2023-10-12 NOTE — Telephone Encounter (Signed)
He needs to continue ENT care for this

## 2023-10-12 NOTE — Telephone Encounter (Signed)
Prescription Request  10/12/2023  LOV: 08/23/2023  What is the name of the medication or equipment? triamterene-hydrochlorothiazide (DYAZIDE) 37.5-25 MG per capsule   Have you contacted your pharmacy to request a refill? Yes   Which pharmacy would you like this sent to?  [id number], [place] - [address] [place] [address] Phone: [phone] Fax: [phone]    Patient notified that their request is being sent to the clinical staff for review and that they should receive a response within 2 business days.   Please advise at [facility] [phone]   Pt called asking if Dr. Milinda Antis can start prescribing meds for him? Pt states Dr. Janeece Riggers Philomena Doheny at  [facility], is the original prescriber of meds. Pt states he requested a refill from their office & was told he's need another hearing test prior to refill. Pt states there aren't any issues with his ear & believes the test isn't necessary. Pt states the office advised him to request refill from his pcp, Dr. Milinda Antis.

## 2023-10-12 NOTE — Telephone Encounter (Signed)
Last OV was for back pain on 08/23/23  See prev message saying:  Pt called asking if Dr. Milinda Antis can start prescribing meds for him? Pt states Dr. Janeece Riggers Philomena Doheny at [facility], is the original prescriber of meds. Pt states he requested a refill from their office & was told he's need another hearing test prior to refill. Pt states there aren't any issues with his ear & believes the test isn't necessary. Pt states the office advised him to request refill from his pcp, Dr. Milinda Antis.

## 2023-10-13 MED ORDER — TRIAMTERENE-HCTZ 37.5-25 MG PO CAPS: 1.0000 | ORAL_CAPSULE | Freq: Every day | ORAL | 0 refills | Status: DC

## 2023-10-13 NOTE — Telephone Encounter (Signed)
Per pt since he isn't having any issues with his hearing and doesn't need to see them, the ENT advised him to have [place] refill med going forward

## 2023-10-13 NOTE — Telephone Encounter (Signed)
It is for he meniere's disease  I sent him 3 months to get by   I still think he should see ENT yearly for this (when convenient)   Thanks

## 2023-10-16 NOTE — Telephone Encounter (Signed)
Left VM letting pt know Dr. Royden Purl comments an instructions

## 2023-12-03 ENCOUNTER — Telehealth: Payer: Self-pay | Admitting: Family Medicine

## 2023-12-03 DIAGNOSIS — R7303 Prediabetes: Secondary | ICD-10-CM

## 2023-12-03 DIAGNOSIS — R7401 Elevation of levels of liver transaminase levels: Secondary | ICD-10-CM

## 2023-12-03 DIAGNOSIS — E7800 Pure hypercholesterolemia, unspecified: Secondary | ICD-10-CM

## 2023-12-03 DIAGNOSIS — E7801 Familial hypercholesterolemia: Secondary | ICD-10-CM

## 2023-12-03 DIAGNOSIS — Z0000 Encounter for general adult medical examination without abnormal findings: Secondary | ICD-10-CM

## 2023-12-03 NOTE — Telephone Encounter (Signed)
-----   Message from Alvina Chou sent at 11/16/2023  2:27 PM EST ----- Regarding: Lab orders for Mon, 1.27.25 Patient is scheduled for CPX labs, please order future labs, Thanks , Camelia Eng

## 2023-12-04 ENCOUNTER — Other Ambulatory Visit (INDEPENDENT_AMBULATORY_CARE_PROVIDER_SITE_OTHER): Payer: BC Managed Care – PPO

## 2023-12-04 DIAGNOSIS — Z0000 Encounter for general adult medical examination without abnormal findings: Secondary | ICD-10-CM

## 2023-12-04 DIAGNOSIS — E7800 Pure hypercholesterolemia, unspecified: Secondary | ICD-10-CM

## 2023-12-04 DIAGNOSIS — R7401 Elevation of levels of liver transaminase levels: Secondary | ICD-10-CM | POA: Diagnosis not present

## 2023-12-04 DIAGNOSIS — R7303 Prediabetes: Secondary | ICD-10-CM

## 2023-12-04 LAB — COMPREHENSIVE METABOLIC PANEL
ALT: 48 U/L (ref 0–53)
AST: 30 U/L (ref 0–37)
Albumin: 5 g/dL (ref 3.5–5.2)
Alkaline Phosphatase: 62 U/L (ref 39–117)
BUN: 16 mg/dL (ref 6–23)
CO2: 30 meq/L (ref 19–32)
Calcium: 9.7 mg/dL (ref 8.4–10.5)
Chloride: 101 meq/L (ref 96–112)
Creatinine, Ser: 0.78 mg/dL (ref 0.40–1.50)
GFR: 112.23 mL/min (ref >60.00)
Glucose, Bld: 99 mg/dL (ref 70–99)
Potassium: 4.2 meq/L (ref 3.5–5.1)
Sodium: 138 meq/L (ref 135–145)
Total Bilirubin: 0.6 mg/dL (ref 0.2–1.2)
Total Protein: 7 g/dL (ref 6.0–8.3)

## 2023-12-04 LAB — LIPID PANEL
Cholesterol: 280 mg/dL — ABNORMAL HIGH (ref 0–200)
HDL: 65.3 mg/dL (ref >39.00)
LDL Cholesterol: 179 mg/dL — ABNORMAL HIGH (ref 0–99)
NonHDL: 215.07
Total CHOL/HDL Ratio: 4
Triglycerides: 180 mg/dL — ABNORMAL HIGH (ref 0.0–149.0)
VLDL: 36 mg/dL (ref 0.0–40.0)

## 2023-12-04 LAB — CBC WITH DIFFERENTIAL/PLATELET
Basophils Absolute: 0.1 10*3/uL (ref 0.0–0.1)
Basophils Relative: 0.6 % (ref 0.0–3.0)
Eosinophils Absolute: 0.3 10*3/uL (ref 0.0–0.7)
Eosinophils Relative: 2.7 % (ref 0.0–5.0)
HCT: 46.1 % (ref 39.0–52.0)
Hemoglobin: 15.6 g/dL (ref 13.0–17.0)
Lymphocytes Relative: 21.9 % (ref 12.0–46.0)
Lymphs Abs: 2.7 10*3/uL (ref 0.7–4.0)
MCHC: 33.9 g/dL (ref 30.0–36.0)
MCV: 96.7 fL (ref 78.0–100.0)
Monocytes Absolute: 0.9 10*3/uL (ref 0.1–1.0)
Monocytes Relative: 7.2 % (ref 3.0–12.0)
Neutro Abs: 8.2 10*3/uL — ABNORMAL HIGH (ref 1.4–7.7)
Neutrophils Relative %: 67.6 % (ref 43.0–77.0)
Platelets: 237 10*3/uL (ref 150.0–400.0)
RBC: 4.77 Mil/uL (ref 4.22–5.81)
RDW: 13.3 % (ref 11.5–15.5)
WBC: 12.2 10*3/uL — ABNORMAL HIGH (ref 4.0–10.5)

## 2023-12-04 LAB — TSH: TSH: 1.99 u[IU]/mL (ref 0.35–5.50)

## 2023-12-04 LAB — HEMOGLOBIN A1C: Hgb A1c MFr Bld: 6.1 % (ref 4.6–6.5)

## 2023-12-11 ENCOUNTER — Encounter: Payer: BC Managed Care – PPO | Admitting: Family Medicine

## 2023-12-11 ENCOUNTER — Ambulatory Visit (INDEPENDENT_AMBULATORY_CARE_PROVIDER_SITE_OTHER): Payer: BC Managed Care – PPO | Admitting: Family Medicine

## 2023-12-11 ENCOUNTER — Encounter: Payer: Self-pay | Admitting: Family Medicine

## 2023-12-11 VITALS — BP 131/85 | HR 82 | Temp 97.9°F | Ht 69.0 in | Wt 220.1 lb

## 2023-12-11 DIAGNOSIS — Z0000 Encounter for general adult medical examination without abnormal findings: Secondary | ICD-10-CM

## 2023-12-11 DIAGNOSIS — E669 Obesity, unspecified: Secondary | ICD-10-CM

## 2023-12-11 DIAGNOSIS — E7800 Pure hypercholesterolemia, unspecified: Secondary | ICD-10-CM

## 2023-12-11 DIAGNOSIS — H8109 Meniere's disease, unspecified ear: Secondary | ICD-10-CM

## 2023-12-11 DIAGNOSIS — F418 Other specified anxiety disorders: Secondary | ICD-10-CM | POA: Diagnosis not present

## 2023-12-11 DIAGNOSIS — F1010 Alcohol abuse, uncomplicated: Secondary | ICD-10-CM | POA: Diagnosis not present

## 2023-12-11 DIAGNOSIS — R7401 Elevation of levels of liver transaminase levels: Secondary | ICD-10-CM

## 2023-12-11 DIAGNOSIS — F17200 Nicotine dependence, unspecified, uncomplicated: Secondary | ICD-10-CM

## 2023-12-11 DIAGNOSIS — R7303 Prediabetes: Secondary | ICD-10-CM

## 2023-12-11 DIAGNOSIS — R350 Frequency of micturition: Secondary | ICD-10-CM

## 2023-12-11 DIAGNOSIS — D72825 Bandemia: Secondary | ICD-10-CM

## 2023-12-11 DIAGNOSIS — R432 Parageusia: Secondary | ICD-10-CM | POA: Diagnosis not present

## 2023-12-11 DIAGNOSIS — E349 Endocrine disorder, unspecified: Secondary | ICD-10-CM

## 2023-12-11 DIAGNOSIS — R5382 Chronic fatigue, unspecified: Secondary | ICD-10-CM

## 2023-12-11 DIAGNOSIS — R030 Elevated blood-pressure reading, without diagnosis of hypertension: Secondary | ICD-10-CM

## 2023-12-11 LAB — POC COVID19 BINAXNOW: SARS Coronavirus 2 Ag: NEGATIVE

## 2023-12-11 NOTE — Assessment & Plan Note (Signed)
Reviewed health habits including diet and exercise and skin cancer prevention Reviewed appropriate screening tests for age  Also reviewed health mt list, fam hx and immunization status , as well as social and family history   See HPI Labs reviewed and ordered Declines flu and pna shots (smoker)  No urinary changes Discussed fall prevention, supplements and exercise for bone density  Discussed smoking cessation  Phq 0 Interested in derm screening visit  Discussed goals for albohol intake  Health Maintenance  Topic Date Due   Pneumococcal Vaccination (1 of 2 - PCV) Never done   Flu Shot  02/05/2024*   COVID-19 Vaccine (1 - 2024-25 season) 09/07/2025*   DTaP/Tdap/Td vaccine (4 - Td or Tdap) 08/02/2032   HPV Vaccine  Completed   Hepatitis C Screening  Completed   HIV Screening  Completed  *Topic was postponed. The date shown is not the original due date.

## 2023-12-11 NOTE — Patient Instructions (Addendum)
Add some strength training to your routine, this is important for bone and brain health and can reduce your risk of falls and help your body use insulin properly and regulate weight  Light weights, exercise bands , and internet videos are a good way to start  Yoga (chair or regular), machines , floor exercises or a gym with machines are also good options   If you want [place] to refer you for counseling let us know (through [facility] behavioral [facility])   For cholesterol Avoid red meat/ fried foods/ egg yolks/ fatty breakfast meats/ butter, cheese and high fat dairy/ and shellfish    For diabetes prevention  Try to get most of your carbohydrates from produce (with the exception of white potatoes) and whole grains Eat less bread/pasta/rice/snack foods/cereals/sweets and other items from the middle of the grocery store (processed carbs)    Come back for a visit if the right shoulder area pain returns or if hands keep cramping   Keep dialing back alcohol  Keep thinking about quitting smoking  If you want to see cardiologist again for cholesterol let us know

## 2023-12-11 NOTE — Assessment & Plan Note (Signed)
Pt is off his dyazide now  He is not certain he every had meniere's  Hearing is stable  Does not want to follow up with ENT at this time

## 2023-12-11 NOTE — Assessment & Plan Note (Signed)
Today  Smell and taste  Neg covid test No other symptoms  May be coming down with viral uri  Will monitor  Call back and Er precautions noted in detail today

## 2023-12-11 NOTE — Assessment & Plan Note (Signed)
Disc in detail risks of smoking and possible outcomes including copd, vascular/ heart disease, cancer , respiratory and sinus infections as well as osteoporosis  Pt voices understanding About 1ppd Not ready to quit  No brathing complaints

## 2023-12-11 NOTE — Assessment & Plan Note (Signed)
Lab Results  Component Value Date   HGBA1C 6.1 12/04/2023   HGBA1C 6.0 11/28/2022   HGBA1C 6.5 06/08/2022   disc imp of low glycemic diet and wt loss to prevent DM2

## 2023-12-11 NOTE — Assessment & Plan Note (Signed)
Pt thinks doing ok  Phq of 0 No meds  Would like to find counselor he feels comfortable with  Offered ref with [facility] when ready He is also looking on his own

## 2023-12-11 NOTE — Assessment & Plan Note (Signed)
Discussed how this problem influences overall health and the risks it imposes  Reviewed plan for weight loss with lower calorie diet (via better food choices (lower glycemic and portion control) along with exercise building up to or more than 30 minutes 5 days per week including some aerobic activity and strength training   Given handout re: Mediterranean diet

## 2023-12-11 NOTE — Assessment & Plan Note (Signed)
Improved off dyazide  Pt wants to stay off of

## 2023-12-11 NOTE — Assessment & Plan Note (Signed)
Off medicine currently  Disc goals for lipids and reasons to control them Rev last labs with pt Rev low sat fat diet in detail Reviewed Dr Blanchie Dessert notes Has had genetic testing Did use leqvio injection-did not tolerate On sish oil With diet LDL is down to 179 Rev CV risks  Wants to work on diet  Declined cardiology /lipid clinic follow up at this time  Avoiding statins due to liver enz elevation in past

## 2023-12-11 NOTE — Assessment & Plan Note (Signed)
Drinking less LFts in normal limits today

## 2023-12-11 NOTE — Assessment & Plan Note (Signed)
Lab Results  Component Value Date   WBC 12.2 (H) 12/04/2023   Stable In s,oker Has seen hematology inpast
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
## 2023-12-11 NOTE — Assessment & Plan Note (Signed)
Blood pressure is higher than prior off dyzide (was taking for meniere's)  BP: 131/85  Encouraged healthy diet/weight loss/exercise as tolerated Also smoking cessation and continued wean on etoh

## 2023-12-11 NOTE — Assessment & Plan Note (Signed)
Pt has cut back to no etoh on work days at all Does drink on weekends- perhaps 1-2 cases of beer  Continues to work on cutting back  Has not est with a mental health counselor yet  Lfts are in normal range this check   Encouraged strongly to keep cutting back with intent to quit Pt would like to quit one day

## 2024-01-13 ENCOUNTER — Other Ambulatory Visit: Payer: Self-pay | Admitting: Family Medicine

## 2024-02-26 ENCOUNTER — Ambulatory Visit: Payer: Self-pay

## 2024-02-26 DIAGNOSIS — M79671 Pain in right foot: Secondary | ICD-10-CM | POA: Diagnosis not present

## 2024-02-26 DIAGNOSIS — M79672 Pain in left foot: Secondary | ICD-10-CM | POA: Diagnosis not present

## 2024-02-26 NOTE — Telephone Encounter (Signed)
 Will see as planned

## 2024-02-26 NOTE — Telephone Encounter (Signed)
  Chief Complaint: BLE swelling Symptoms: swelling Frequency: about a week, end of each day Pertinent Negatives: Patient denies fever, CP, SOB, redness,  Disposition: [] ED /[] Urgent Care (no appt availability in office) / [x] Appointment(In office/virtual)/ []  [facility]/ [] Home Care/ [] Refused Recommended Disposition /[] [place]/ []  Follow-up with PCP Additional Notes: Pt states that he has been having swelling in his BLE for about a week, this occurs at night. Pt states that he use to take a water pill for other reasons, and stopped taking the medication about 4 months ago. Pt states that he is on his feet all day for work. Pt scheduled.  Copied from CRM [id number]. Topic: Clinical - Red Word Triage >> Feb 26, 2024 12:50 PM Kita Perish H wrote: [facility] that prompted transfer to Nurse Triage: Fatigue, ankles and legs swollen Reason for Disposition  MILD or MODERATE ankle swelling (e.g., can't move joint normally, can't do usual activities) (Exceptions: Itchy, localized swelling; swelling is chronic.)  Answer Assessment - Initial Assessment Questions 1. LOCATION: "Which ankle is swollen?" "Where is the swelling?"     Below the knee swelling 2. ONSET: "When did the swelling start?"     About a week ago 3. SWELLING: "How bad is the swelling?" Or, "How large is it?" (e.g., mild, moderate, severe; size of localized swelling)    - NONE: No joint swelling.   - LOCALIZED: Localized; small area of puffy or swollen skin (e.g., insect bite, skin irritation).   - MILD: Joint looks or feels mildly swollen or puffy.   - MODERATE: Swollen; interferes with normal activities (e.g., work or school); decreased range of movement; may be limping.   - SEVERE: Very swollen; can't move swollen joint at all; limping a lot or unable to walk.     Mild, end of day 4. PAIN: "Is there any pain?" If Yes, ask: "How bad is it?" (Scale 1-10; or mild, moderate, severe)   - NONE (0): no pain.   - MILD  (1-3): doesn't interfere with normal activities.    - MODERATE (4-7): interferes with normal activities (e.g., work or school) or awakens from sleep, limping.    - SEVERE (8-10): excruciating pain, unable to do any normal activities, unable to walk.      none 5. CAUSE: "What do you think caused the ankle swelling?"     unsure 6. OTHER SYMPTOMS: "Do you have any other symptoms?" (e.g., fever, chest pain, difficulty breathing, calf pain)     fatigue  Protocols used: Ankle Swelling-A-AH

## 2024-02-28 ENCOUNTER — Encounter: Payer: Self-pay | Admitting: Family Medicine

## 2024-02-28 ENCOUNTER — Ambulatory Visit (INDEPENDENT_AMBULATORY_CARE_PROVIDER_SITE_OTHER): Admitting: Family Medicine

## 2024-02-28 VITALS — BP 124/78 | HR 78 | Temp 98.4°F | Ht 69.0 in | Wt 219.2 lb

## 2024-02-28 DIAGNOSIS — R350 Frequency of micturition: Secondary | ICD-10-CM | POA: Diagnosis not present

## 2024-02-28 DIAGNOSIS — R600 Localized edema: Secondary | ICD-10-CM | POA: Diagnosis not present

## 2024-02-28 DIAGNOSIS — E669 Obesity, unspecified: Secondary | ICD-10-CM

## 2024-02-28 DIAGNOSIS — F1010 Alcohol abuse, uncomplicated: Secondary | ICD-10-CM

## 2024-02-28 DIAGNOSIS — R5382 Chronic fatigue, unspecified: Secondary | ICD-10-CM | POA: Diagnosis not present

## 2024-02-28 DIAGNOSIS — R7401 Elevation of levels of liver transaminase levels: Secondary | ICD-10-CM | POA: Diagnosis not present

## 2024-02-28 DIAGNOSIS — F17200 Nicotine dependence, unspecified, uncomplicated: Secondary | ICD-10-CM

## 2024-02-28 LAB — BASIC METABOLIC PANEL WITH GFR
BUN: 21 mg/dL (ref 6–23)
CO2: 29 meq/L (ref 19–32)
Calcium: 10 mg/dL (ref 8.4–10.5)
Chloride: 103 meq/L (ref 96–112)
Creatinine, Ser: 0.83 mg/dL (ref 0.40–1.50)
GFR: 109.96 mL/min (ref >60.00)
Glucose, Bld: 96 mg/dL (ref 70–99)
Potassium: 4.1 meq/L (ref 3.5–5.1)
Sodium: 138 meq/L (ref 135–145)

## 2024-02-28 LAB — TSH: TSH: 2.76 u[IU]/mL (ref 0.35–5.50)

## 2024-02-28 LAB — POC URINALSYSI DIPSTICK (AUTOMATED)
Bilirubin, UA: NEGATIVE
Blood, UA: NEGATIVE
Glucose, UA: NEGATIVE
Ketones, UA: NEGATIVE
Leukocytes, UA: NEGATIVE
Nitrite, UA: NEGATIVE
Protein, UA: NEGATIVE
Spec Grav, UA: 1.01 (ref 1.010–1.025)
Urobilinogen, UA: 0.2 U/dL
pH, UA: 6 (ref 5.0–8.0)

## 2024-02-28 LAB — HEPATIC FUNCTION PANEL
ALT: 34 U/L (ref 0–53)
AST: 19 U/L (ref 0–37)
Albumin: 4.9 g/dL (ref 3.5–5.2)
Alkaline Phosphatase: 69 U/L (ref 39–117)
Bilirubin, Direct: 0.1 mg/dL (ref 0.0–0.3)
Total Bilirubin: 0.4 mg/dL (ref 0.2–1.2)
Total Protein: 7 g/dL (ref 6.0–8.3)

## 2024-02-28 LAB — VITAMIN B12: Vitamin B-12: >1537 pg/mL — ABNORMAL HIGH (ref 211–911)

## 2024-02-28 LAB — FERRITIN: Ferritin: 180.4 ng/mL (ref 22.0–322.0)

## 2024-02-28 LAB — VITAMIN D 25 HYDROXY (VIT D DEFICIENCY, FRACTURES): VITD: 43.85 ng/mL (ref 30.00–100.00)

## 2024-02-28 LAB — IRON: Iron: 117 ug/dL (ref 42–165)

## 2024-02-28 LAB — T4, FREE: Free T4: 0.69 ng/dL (ref 0.60–1.60)

## 2024-02-28 MED ORDER — IBUPROFEN 800 MG PO TABS: 800.0000 mg | ORAL_TABLET | Freq: Three times a day (TID) | ORAL | 1 refills | Status: AC | PRN

## 2024-02-28 NOTE — Assessment & Plan Note (Signed)
 Pt is trying a low carb diet  Encouraged to add strength building exercise  Commended on quitting etoh   Encouraged to avoid excess sodium and DASH eating handout given

## 2024-02-28 NOTE — Assessment & Plan Note (Signed)
 No changes  Not ready to quit
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
## 2024-02-28 NOTE — Assessment & Plan Note (Signed)
 Quit 34 days ago  Commended ! Seeing counselor

## 2024-02-28 NOTE — Patient Instructions (Addendum)
 Let's do labs for swelling and fatigue   Drink more water  Avoid excess sodium    Consider some knee high support socks for work under pants   Labs today  Urinalysis today  We will reach out with results

## 2024-02-28 NOTE — Assessment & Plan Note (Signed)
 Pt has stopped drinking alcohol  , day 34 -commended  Lab today

## 2024-02-28 NOTE — Assessment & Plan Note (Signed)
 Worse since stopping etoh  ? Of sleep apnea on chart-, unsure how / has not had sleep study to his recollection and unsure if he snores  Mood is stable, seeing a counselor  Some pedal edema but no cardiac symptoms   Labs ordered

## 2024-02-28 NOTE — Assessment & Plan Note (Signed)
 For past month  Dependent- happens at end of work day (standing)  No shortness of breath/ pnd/ cp or other cardiac symptoms Is more fatigues  Not a problem early in day or on days off  Has a varicose vein in right foot and some spider veins-discussed possible venous insuff Also fam history of lymphedema in mother   Encouraged to avoid excess sodium (DASH eating handout given)  Encouraged more water intake  Leg elevation when sitting Use of knee high support socks for work   Labs today  Has stopped drinking etoh - commended

## 2024-02-28 NOTE — Assessment & Plan Note (Signed)
 Urinalysis clear  Denies change in flow /stream or problems emptying   Labs pending

## 2024-07-02 DIAGNOSIS — D2361 Other benign neoplasm of skin of right upper limb, including shoulder: Secondary | ICD-10-CM | POA: Diagnosis not present

## 2024-07-02 DIAGNOSIS — D2362 Other benign neoplasm of skin of left upper limb, including shoulder: Secondary | ICD-10-CM | POA: Diagnosis not present

## 2024-07-02 DIAGNOSIS — L720 Epidermal cyst: Secondary | ICD-10-CM | POA: Diagnosis not present

## 2024-07-02 DIAGNOSIS — L821 Other seborrheic keratosis: Secondary | ICD-10-CM | POA: Diagnosis not present

## 2024-09-16 ENCOUNTER — Ambulatory Visit: Payer: Self-pay

## 2024-09-16 ENCOUNTER — Other Ambulatory Visit: Payer: Self-pay | Admitting: Family Medicine

## 2024-09-16 MED ORDER — ALPRAZOLAM 0.5 MG PO TABS
0.5000 mg | ORAL_TABLET | Freq: Two times a day (BID) | ORAL | 0 refills | Status: DC | PRN
Start: 2024-09-16 — End: 2024-09-19

## 2024-09-16 NOTE — Telephone Encounter (Signed)
 Pt notified of Dr. Graham comment and instructions and sedation cautions given.

## 2024-09-16 NOTE — Telephone Encounter (Signed)
 FYI Only or Action Required?: Action required by provider: clinical question for provider and update on patient condition.  Patient was last seen in primary care on 02/28/2024 by Randeen Laine LABOR, MD.  Called Nurse Triage reporting Anxiety.  Symptoms began today.  Interventions attempted: Nothing.  Symptoms are: unchanged.  Triage Disposition: See PCP When Office is Open (Within 3 Days)  Patient/caregiver understands and will follow disposition?: No, wishes to speak with PCP     Copied from CRM #[id number]. Topic: Clinical - Red Word Triage >> Sep 16, 2024 10:46 AM Drema MATSU wrote: Red Word that prompted transfer to Nurse Triage: Patient just lost his father and is requesting medication for his nerves. Symptoms: panic attack      Reason for Disposition  Recent traumatic event (e.g., death of a loved one, job loss, victim/witness of crime)  Answer Assessment - Initial Assessment Questions Patient reports his father recently passed away and he is requesting a prescription for something to help with his anxiety. He states he is unable to come in for an appointment at this time. Please advise.       1. CONCERN: Did anything happen that prompted you to call today?      Increased anxiety   2. ANXIETY SYMPTOMS: Can you describe how you (your loved one; patient) have been feeling? (e.g., tense, restless, panicky, anxious, keyed up, overwhelmed, sense of impending doom).      Anxious 3. ONSET: How long have you been feeling this way? (e.g., hours, days, weeks)     30 minutes ago 4. SEVERITY: How would you rate the level of anxiety? (e.g., 0 - 10; or mild, moderate, severe).     Moderate  5. FUNCTIONAL IMPAIRMENT: How have these feelings affected your ability to do daily activities? Have you had more difficulty than usual doing your normal daily activities? (e.g., getting better, same, worse; self-care, school, work, interactions)     Some functional impairment  6.  HISTORY: Have you felt this way before? Have you ever been diagnosed with an anxiety problem in the past? (e.g., generalized anxiety disorder, panic attacks, PTSD). If Yes, ask: How was this problem treated? (e.g., medicines, counseling, etc.)     No 7. RISK OF HARM - SUICIDAL IDEATION: Do you ever have thoughts of hurting or killing yourself? If Yes, ask:  Do you have these feelings now? Do you have a plan on how you would do this?     No 10. POTENTIAL TRIGGERS: Do you drink caffeinated beverages (e.g., coffee, colas, teas), and how much daily? Do you drink alcohol  or use any drugs? Have you started any new medicines recently?       Father recently passed  61. PATIENT SUPPORT: Who is with you now? Who do you live with? Do you have family or friends who you can talk to?        Family  Protocols used: Anxiety and Panic Attack-A-AH

## 2024-09-16 NOTE — Telephone Encounter (Signed)
 So sorry to hear that   sent in 8 xanax   Twice daily prn for severe anxiety  Short term / can sedate and is habit forming   Use with caution and do not drive with it   Follow up as needed for anxiety/grief

## 2024-09-18 ENCOUNTER — Telehealth: Payer: Self-pay

## 2024-09-18 NOTE — Telephone Encounter (Signed)
 Copied from CRM [id number]. Topic: Clinical - Medication Question >> Sep 18, 2024  4:20 PM Viola F wrote: Reason for CRM: Patient says the ALPRAZolam  (XANAX ) 0.5 MG tablet is not working, wants to know if he can increase the dosage or get an alternative medication that is stronger [phone] (H)

## 2024-09-18 NOTE — Telephone Encounter (Signed)
 He can try taking 2 at a time (let me know if he needs more sent)  Keep me posted

## 2024-09-19 ENCOUNTER — Other Ambulatory Visit: Payer: Self-pay | Admitting: Family Medicine

## 2024-09-19 MED ORDER — ALPRAZOLAM 0.5 MG PO TABS: 0.5000 mg | ORAL_TABLET | Freq: Two times a day (BID) | ORAL | 0 refills | Status: AC | PRN

## 2024-09-19 NOTE — Telephone Encounter (Signed)
 Called patient Lance Foley states that Lance Foley took 2 tab yesterday and and felt like it helped with his symptoms more than one tab. Lance Foley denies any side effects with the two tab. States that Lance Foley has 3 tab left. Lance Foley is requesting refill of tow tabs to last for about 2 weeks.

## 2024-09-19 NOTE — Telephone Encounter (Signed)
 Copied from CRM #[id number]. Topic: General - Call Back - No Documentation >> Sep 19, 2024 11:26 AM Terri MATSU wrote: Reason for CRM: Patient returning call. Did relay Dr.Tower comments but he stated he was taking 2 and it's not working. Wanted to speak with her.

## 2024-09-19 NOTE — Telephone Encounter (Signed)
 I can only do a week supply based on the controlled/habit forming nature of this medicine   Above and beyond that please follow up for a visit so we can discuss a longer term solution  I hope it helps   Use caution of sedation and habit-only use when absolutely necessary

## 2024-09-19 NOTE — Telephone Encounter (Signed)
 Left VM requesting pt to call the office back   **E2C2** OKAY TO RELAY DR. GRAHAM COMMENTS BELOW AND SEE IF NEEDS NEW RX SENT**

## 2024-09-20 NOTE — Telephone Encounter (Signed)
 Pt notified of Dr. Graham comments. Pt said he will only use meds prn he said they are sedating some so he hasn't taken them as often but if he thinks he needs a long term plan he will follow up with PCP. I did advise pt it could be a virtual visit if needed

## 2024-12-08 ENCOUNTER — Encounter: Payer: Self-pay | Admitting: Family Medicine

## 2024-12-08 ENCOUNTER — Telehealth: Payer: Self-pay | Admitting: Family Medicine

## 2024-12-08 DIAGNOSIS — E7800 Pure hypercholesterolemia, unspecified: Secondary | ICD-10-CM

## 2024-12-08 DIAGNOSIS — R7303 Prediabetes: Secondary | ICD-10-CM

## 2024-12-08 DIAGNOSIS — Z0000 Encounter for general adult medical examination without abnormal findings: Secondary | ICD-10-CM

## 2024-12-08 NOTE — Telephone Encounter (Signed)
-----   Message from Harlene Du sent at 11/27/2024  2:06 PM EST ----- Regarding: Lab Mon 12/09/24 Hello,  Patient is coming in for CPE labs. Can we get orders please. Also a mention of testosterone  labs in the note comment.   Thanks

## 2024-12-09 ENCOUNTER — Other Ambulatory Visit

## 2024-12-11 ENCOUNTER — Other Ambulatory Visit

## 2024-12-11 ENCOUNTER — Encounter: Admitting: Family Medicine

## 2025-01-13 ENCOUNTER — Other Ambulatory Visit

## 2025-01-20 ENCOUNTER — Encounter: Admitting: Family Medicine
